# Patient Record
Sex: Female | Born: 1945 | Race: Black or African American | Hispanic: No | Marital: Married | Smoking: Never smoker
Health system: Southern US, Community
[De-identification: ages and names within clinical notes are randomized; demographics above are authoritative.]

## PROBLEM LIST (undated history)

## (undated) DIAGNOSIS — R739 Hyperglycemia, unspecified: Secondary | ICD-10-CM

## (undated) DIAGNOSIS — I4891 Unspecified atrial fibrillation: Secondary | ICD-10-CM

## (undated) DIAGNOSIS — I10 Essential (primary) hypertension: Secondary | ICD-10-CM

## (undated) DIAGNOSIS — L910 Hypertrophic scar: Secondary | ICD-10-CM

## (undated) DIAGNOSIS — F419 Anxiety disorder, unspecified: Secondary | ICD-10-CM

## (undated) DIAGNOSIS — F32A Depression, unspecified: Secondary | ICD-10-CM

## (undated) DIAGNOSIS — E538 Deficiency of other specified B group vitamins: Secondary | ICD-10-CM

## (undated) DIAGNOSIS — F329 Major depressive disorder, single episode, unspecified: Secondary | ICD-10-CM

## (undated) DIAGNOSIS — Z9049 Acquired absence of other specified parts of digestive tract: Secondary | ICD-10-CM

## (undated) HISTORY — PX: HAND SURGERY: SHX662

## (undated) HISTORY — DX: Major depressive disorder, single episode, unspecified: F32.9

## (undated) HISTORY — PX: SMALL INTESTINE SURGERY: SHX150

## (undated) HISTORY — PX: TOE SURGERY: SHX1073

## (undated) HISTORY — DX: Hyperglycemia, unspecified: R73.9

## (undated) HISTORY — PX: MYOMECTOMY: SHX85

## (undated) HISTORY — DX: Acquired absence of other specified parts of digestive tract: Z90.49

## (undated) HISTORY — DX: Depression, unspecified: F32.A

## (undated) HISTORY — DX: Anxiety disorder, unspecified: F41.9

## (undated) HISTORY — PX: COLON SURGERY: SHX602

## (undated) HISTORY — DX: Hypertrophic scar: L91.0

## (undated) HISTORY — DX: Deficiency of other specified B group vitamins: E53.8

---

## 1998-10-03 ENCOUNTER — Encounter: Payer: Self-pay | Admitting: Endocrinology

## 1998-10-03 ENCOUNTER — Ambulatory Visit (HOSPITAL_COMMUNITY): Admission: RE | Admit: 1998-10-03 | Discharge: 1998-10-03 | Payer: Self-pay | Admitting: Endocrinology

## 1999-02-09 ENCOUNTER — Encounter: Payer: Self-pay | Admitting: Emergency Medicine

## 1999-02-09 ENCOUNTER — Emergency Department (HOSPITAL_COMMUNITY): Admission: EM | Admit: 1999-02-09 | Discharge: 1999-02-09 | Payer: Self-pay | Admitting: Emergency Medicine

## 2000-09-27 ENCOUNTER — Emergency Department (HOSPITAL_COMMUNITY): Admission: EM | Admit: 2000-09-27 | Discharge: 2000-09-27 | Payer: Self-pay | Admitting: Emergency Medicine

## 2001-03-19 ENCOUNTER — Encounter: Admission: RE | Admit: 2001-03-19 | Discharge: 2001-03-19 | Payer: Self-pay | Admitting: Obstetrics

## 2001-04-13 ENCOUNTER — Encounter: Admission: RE | Admit: 2001-04-13 | Discharge: 2001-04-13 | Payer: Self-pay

## 2001-04-20 ENCOUNTER — Ambulatory Visit (HOSPITAL_COMMUNITY): Admission: RE | Admit: 2001-04-20 | Discharge: 2001-04-20 | Payer: Self-pay | Admitting: Obstetrics

## 2001-11-15 ENCOUNTER — Inpatient Hospital Stay (HOSPITAL_COMMUNITY): Admission: AD | Admit: 2001-11-15 | Discharge: 2001-11-15 | Payer: Self-pay | Admitting: Obstetrics & Gynecology

## 2002-03-23 ENCOUNTER — Ambulatory Visit (HOSPITAL_COMMUNITY): Admission: RE | Admit: 2002-03-23 | Discharge: 2002-03-23 | Payer: Self-pay | Admitting: Obstetrics

## 2002-03-23 ENCOUNTER — Encounter: Payer: Self-pay | Admitting: Obstetrics

## 2002-04-05 ENCOUNTER — Emergency Department (HOSPITAL_COMMUNITY): Admission: EM | Admit: 2002-04-05 | Discharge: 2002-04-05 | Payer: Self-pay | Admitting: Emergency Medicine

## 2002-05-06 ENCOUNTER — Emergency Department (HOSPITAL_COMMUNITY): Admission: EM | Admit: 2002-05-06 | Discharge: 2002-05-06 | Payer: Self-pay | Admitting: Emergency Medicine

## 2002-05-06 ENCOUNTER — Encounter: Payer: Self-pay | Admitting: Emergency Medicine

## 2003-08-30 ENCOUNTER — Encounter: Admission: RE | Admit: 2003-08-30 | Discharge: 2003-08-30 | Payer: Self-pay | Admitting: Obstetrics and Gynecology

## 2004-01-22 ENCOUNTER — Emergency Department (HOSPITAL_COMMUNITY): Admission: EM | Admit: 2004-01-22 | Discharge: 2004-01-22 | Payer: Self-pay | Admitting: Emergency Medicine

## 2004-02-22 ENCOUNTER — Emergency Department (HOSPITAL_COMMUNITY): Admission: EM | Admit: 2004-02-22 | Discharge: 2004-02-22 | Payer: Self-pay | Admitting: Emergency Medicine

## 2004-03-13 ENCOUNTER — Emergency Department (HOSPITAL_COMMUNITY): Admission: EM | Admit: 2004-03-13 | Discharge: 2004-03-14 | Payer: Self-pay

## 2004-06-01 ENCOUNTER — Ambulatory Visit (HOSPITAL_COMMUNITY): Admission: RE | Admit: 2004-06-01 | Discharge: 2004-06-01 | Payer: Self-pay | Admitting: Internal Medicine

## 2004-07-13 ENCOUNTER — Ambulatory Visit (HOSPITAL_COMMUNITY): Admission: RE | Admit: 2004-07-13 | Discharge: 2004-07-13 | Payer: Self-pay | Admitting: Obstetrics

## 2004-07-30 ENCOUNTER — Encounter: Admission: RE | Admit: 2004-07-30 | Discharge: 2004-10-09 | Payer: Self-pay | Admitting: Family Medicine

## 2005-01-25 ENCOUNTER — Encounter: Admission: RE | Admit: 2005-01-25 | Discharge: 2005-04-25 | Payer: Self-pay | Admitting: Family Medicine

## 2007-01-05 ENCOUNTER — Encounter: Admission: RE | Admit: 2007-01-05 | Discharge: 2007-01-05 | Payer: Self-pay | Admitting: Internal Medicine

## 2011-01-06 ENCOUNTER — Encounter: Payer: Self-pay | Admitting: *Deleted

## 2011-01-06 ENCOUNTER — Encounter: Payer: Self-pay | Admitting: Internal Medicine

## 2011-01-07 ENCOUNTER — Encounter: Payer: Self-pay | Admitting: Internal Medicine

## 2011-05-03 NOTE — Group Therapy Note (Signed)
   NAME:  Anita Dickerson, Anita Dickerson                        ACCOUNT NO.:  [id number]   MEDICAL RECORD NO.:  [id number]                   PATIENT TYPE:  OUT   LOCATION:  [facility]                           FACILITY:  [facility]   PHYSICIAN:  Ellis Parents, MD                 DATE OF BIRTH:  01/21/1946   DATE OF SERVICE:                                    CLINIC NOTE   This is a 65 year old postmenopausal female with a chief complaint of six  months of hot flashes and mood changes.  The patient requests Premarin and  progesterone for treatment.  She says her hot flashes last for hours at a  time and she gets hot flashes and flushes every day.  She has also had  difficulty sleeping for several years.  The patient also notes increased  stress due to caring for her mother who lives at home with her.  Her mother  has dementia and several other health problems.  The patient has been  prescribed Prempro in the past but did not want to take that medicine.  The  patient was offered venlafaxine, Effexor XR, for anxiety/insomnia but the  patient declined this medicine.  The patient was also counseled on the  benefits and risks of hormone replacement therapy and the patient said she  will think about trying Premarin and progesterone and will return to the  clinic if she desires to start hormone replacement therapy.  The patient's  last Pap smear was in 2002.  We asked her if she wanted to have a Pap smear  done today.  She declined and said she will return at a later visit.  The  patient's last mammogram was done in 2002 and she will schedule a mammogram  when she comes back for her next visit.  The patient also says she has had a  DEXA scan done one year ago at [facility] and we will try to obtain the records of  that DEXA scan when she comes back for her next visit.     Ellis Parents, MD                       Ellis Parents, MD    SA/MEDQ  D:  08/30/2003  T:  08/30/2003  Job:  [id number]

## 2011-05-19 ENCOUNTER — Emergency Department (HOSPITAL_COMMUNITY): Payer: Medicare Other

## 2011-05-19 ENCOUNTER — Inpatient Hospital Stay (HOSPITAL_COMMUNITY)
Admission: EM | Admit: 2011-05-19 | Discharge: 2011-05-31 | DRG: 329 | Disposition: A | Discharge status: Home / Self Care | Payer: Medicare Other | Attending: Surgery | Admitting: Surgery

## 2011-05-19 DIAGNOSIS — E871 Hypo-osmolality and hyponatremia: Secondary | ICD-10-CM | POA: Diagnosis present

## 2011-05-19 DIAGNOSIS — I498 Other specified cardiac arrhythmias: Secondary | ICD-10-CM | POA: Diagnosis not present

## 2011-05-19 DIAGNOSIS — K560 Paralytic ileus: Secondary | ICD-10-CM | POA: Diagnosis not present

## 2011-05-19 DIAGNOSIS — K562 Volvulus: Secondary | ICD-10-CM | POA: Diagnosis present

## 2011-05-19 DIAGNOSIS — K912 Postsurgical malabsorption, not elsewhere classified: Secondary | ICD-10-CM | POA: Diagnosis present

## 2011-05-19 DIAGNOSIS — K565 Intestinal adhesions [bands], unspecified as to partial versus complete obstruction: Principal | ICD-10-CM | POA: Diagnosis present

## 2011-05-19 DIAGNOSIS — K55059 Acute (reversible) ischemia of intestine, part and extent unspecified: Secondary | ICD-10-CM | POA: Diagnosis present

## 2011-05-19 DIAGNOSIS — R197 Diarrhea, unspecified: Secondary | ICD-10-CM | POA: Diagnosis present

## 2011-05-19 DIAGNOSIS — E875 Hyperkalemia: Secondary | ICD-10-CM | POA: Diagnosis present

## 2011-05-19 DIAGNOSIS — R112 Nausea with vomiting, unspecified: Secondary | ICD-10-CM | POA: Diagnosis present

## 2011-05-19 LAB — CBC
MCH: 29.8 pg (ref 26.0–34.0)
MCHC: 33.7 g/dL (ref 30.0–36.0)
MCV: 88.4 fL (ref 78.0–100.0)
Platelets: 220 10*3/uL (ref 150–400)
RDW: 14.8 % (ref 11.5–15.5)
WBC: 7.5 10*3/uL (ref 4.0–10.5)

## 2011-05-19 LAB — COMPREHENSIVE METABOLIC PANEL
Alkaline Phosphatase: 85 U/L (ref 39–117)
CO2: 25 mEq/L (ref 19–32)
Calcium: 9.5 mg/dL (ref 8.4–10.5)
Chloride: 96 mEq/L (ref 96–112)
Creatinine, Ser: 0.83 mg/dL (ref 0.4–1.2)
GFR calc Af Amer: >60 mL/min (ref >60)
GFR calc non Af Amer: >60 mL/min (ref >60)
Sodium: 134 mEq/L — ABNORMAL LOW (ref 135–145)
Total Protein: 8.3 g/dL (ref 6.0–8.3)

## 2011-05-19 LAB — URINALYSIS, ROUTINE W REFLEX MICROSCOPIC
Hgb urine dipstick: NEGATIVE
Specific Gravity, Urine: 1.043 — ABNORMAL HIGH (ref 1.005–1.030)
pH: 6 (ref 5.0–8.0)

## 2011-05-19 LAB — LIPASE, BLOOD: Lipase: 29 U/L (ref 11–59)

## 2011-05-19 LAB — DIFFERENTIAL
Basophils Absolute: 0 10*3/uL (ref 0.0–0.1)
Monocytes Absolute: 0.6 10*3/uL (ref 0.1–1.0)

## 2011-05-19 MED ORDER — IOHEXOL 300 MG/ML  SOLN
100.0000 mL | Freq: Once | INTRAMUSCULAR | Status: AC | PRN
  Administered 2011-05-19: 100 mL via INTRAVENOUS

## 2011-05-20 ENCOUNTER — Inpatient Hospital Stay (HOSPITAL_COMMUNITY): Payer: Medicare Other

## 2011-05-20 ENCOUNTER — Other Ambulatory Visit: Payer: Self-pay | Admitting: General Surgery

## 2011-05-20 LAB — BASIC METABOLIC PANEL
BUN: 18 mg/dL (ref 6–23)
BUN: 11 mg/dL (ref 6–23)
CO2: 23 mEq/L (ref 19–32)
Calcium: 8.7 mg/dL (ref 8.4–10.5)
Calcium: 7.3 mg/dL — ABNORMAL LOW (ref 8.4–10.5)
Chloride: 102 mEq/L (ref 96–112)
Creatinine, Ser: 0.7 mg/dL (ref 0.4–1.2)
Creatinine, Ser: 0.47 mg/dL (ref 0.4–1.2)
GFR calc Af Amer: >60 mL/min (ref >60)
Potassium: 4.5 mEq/L (ref 3.5–5.1)
Sodium: 132 mEq/L — ABNORMAL LOW (ref 135–145)
Sodium: 130 mEq/L — ABNORMAL LOW (ref 135–145)

## 2011-05-20 LAB — CBC
Hemoglobin: 11 g/dL — ABNORMAL LOW (ref 12.0–15.0)
MCH: 29.2 pg (ref 26.0–34.0)
MCH: 29.1 pg (ref 26.0–34.0)
MCHC: 32.7 g/dL (ref 30.0–36.0)
MCV: 89.3 fL (ref 78.0–100.0)
MCV: 88.6 fL (ref 78.0–100.0)
RBC: 3.78 MIL/uL — ABNORMAL LOW (ref 3.87–5.11)
RDW: 15 % (ref 11.5–15.5)
RDW: 14.6 % (ref 11.5–15.5)
WBC: 2.3 10*3/uL — ABNORMAL LOW (ref 4.0–10.5)

## 2011-05-20 LAB — PHOSPHORUS: Phosphorus: 2.6 mg/dL (ref 2.3–4.6)

## 2011-05-20 LAB — MRSA PCR SCREENING: MRSA by PCR: NEGATIVE

## 2011-05-20 LAB — CA 125: CA 125: 8.2 U/mL (ref 0.0–30.2)

## 2011-05-21 ENCOUNTER — Inpatient Hospital Stay (HOSPITAL_COMMUNITY): Payer: Medicare Other

## 2011-05-21 LAB — PHOSPHORUS: Phosphorus: 2.1 mg/dL — ABNORMAL LOW (ref 2.3–4.6)

## 2011-05-21 LAB — BASIC METABOLIC PANEL
CO2: 25 mEq/L (ref 19–32)
Calcium: 7.3 mg/dL — ABNORMAL LOW (ref 8.4–10.5)
Chloride: 99 mEq/L (ref 96–112)
Creatinine, Ser: 0.53 mg/dL (ref 0.4–1.2)
Glucose, Bld: 125 mg/dL — ABNORMAL HIGH (ref 70–99)
Potassium: 4.5 mEq/L (ref 3.5–5.1)
Sodium: 129 mEq/L — ABNORMAL LOW (ref 135–145)

## 2011-05-21 LAB — MAGNESIUM: Magnesium: 1.9 mg/dL (ref 1.5–2.5)

## 2011-05-21 LAB — CBC
MCH: 29.3 pg (ref 26.0–34.0)
MCHC: 33.5 g/dL (ref 30.0–36.0)
MCV: 87.7 fL (ref 78.0–100.0)
RBC: 3.17 MIL/uL — ABNORMAL LOW (ref 3.87–5.11)
WBC: 5.7 10*3/uL (ref 4.0–10.5)

## 2011-05-22 DIAGNOSIS — I471 Supraventricular tachycardia: Secondary | ICD-10-CM

## 2011-05-22 LAB — DIFFERENTIAL
Basophils Relative: 0 % (ref 0–1)
Eosinophils Absolute: 0 10*3/uL (ref 0.0–0.7)
Monocytes Absolute: 0.6 10*3/uL (ref 0.1–1.0)
Neutrophils Relative %: 82 % — ABNORMAL HIGH (ref 43–77)

## 2011-05-22 LAB — COMPREHENSIVE METABOLIC PANEL
Albumin: 1.6 g/dL — ABNORMAL LOW (ref 3.5–5.2)
Alkaline Phosphatase: 50 U/L (ref 39–117)
BUN: 7 mg/dL (ref 6–23)
Potassium: 3.7 mEq/L (ref 3.5–5.1)
Total Protein: 4.7 g/dL — ABNORMAL LOW (ref 6.0–8.3)

## 2011-05-22 LAB — GLUCOSE, CAPILLARY
Glucose-Capillary: 121 mg/dL — ABNORMAL HIGH (ref 70–99)
Glucose-Capillary: 137 mg/dL — ABNORMAL HIGH (ref 70–99)

## 2011-05-22 LAB — TRIGLYCERIDES: Triglycerides: 104 mg/dL (ref <150)

## 2011-05-22 LAB — CBC
MCV: 87.8 fL (ref 78.0–100.0)
Platelets: 110 10*3/uL — ABNORMAL LOW (ref 150–400)
RDW: 14.6 % (ref 11.5–15.5)
WBC: 6.9 10*3/uL (ref 4.0–10.5)

## 2011-05-22 LAB — PHOSPHORUS: Phosphorus: 1.9 mg/dL — ABNORMAL LOW (ref 2.3–4.6)

## 2011-05-22 LAB — CHOLESTEROL, TOTAL: Cholesterol: 105 mg/dL (ref 0–200)

## 2011-05-22 NOTE — Op Note (Signed)
NAMEMEHR, DEPAOLI              ACCOUNT NO.:  [id number]  MEDICAL RECORD NO.:  [id number]  LOCATION:  1230                         FACILITY:  [facility]  PHYSICIAN:  Anselm Pancoast. Bryonna Sundby, M.D.DATE OF BIRTH:  1946-01-10  DATE OF PROCEDURE:  05/20/2011 DATE OF DISCHARGE:                              OPERATIVE REPORT   PREOPERATIVE DIAGNOSIS:  Small-bowel obstruction probably secondary to adhesions.  POSTOPERATIVE DIAGNOSIS:  Infarcted about 70 inches of small bowel, at least all of the ileum secondary to mid-gut volvulus and adhesion kind of from the left fallopian tube.  SURGEON:  Anselm Pancoast. Zachery Dakins, MD  ASSISTANT:  __________  HISTORY:  Anita Dickerson is a 65 year old black female who is quite exercise and though she is very petite and according to her husband, she does not really have a problem with bowel function, but she uses laxatives frequently and exercises quite vigorously for basically to keep herself trim and thin.  She started having bad onset of abdominal pain on Friday evening.  She had a previous partial hysterectomy done vaginally for a fibroid I think 2 years ago, and no previous other abdominal surgery.  She took some Milk of Magnesia and gave herself an enema, had a bowel movement but the pain persisted and then she presented to the emergency room on May 19, 2011, and was seen by Dr. Gerrit Friends.  She thought that she had food poisoning, says she had eaten out on Friday and on CT which was done yesterday showed a near complete obstruction in the upper pelvis and distal small bowel consistent with a small bowel obstruction.  General Surgery was called for admission and she was examined by Dr. Gerrit Friends.  He noted tender abdomen and was mildly distended.  No signs of hernias.  Mild diffuse tenderness and a few bowel sounds.  Her white count was only 7500, platelet count normal, electrolytes normal and he felt that a trial of conservative management was needed since  she had had some bowel movements after taking the laxative.  She did have a moderate volume of ascites felt to be secondary to reactive.  This morning I saw the patient on exam and her abdomen was quite quiet, she was definitely distended.  She had had some bilious NG drainage and repeat abdominal x-rays were not improved.  I recommended that we go ahead and add her to the OR schedule.  She said her husband was down at [facility] where he receives treatment for posttraumatic stress syndrome following the [place] War problems.  He was called and she was added to the OR schedule, hopefully schedule about 11 o'clock.  Surgery was a little late in getting started, and she was given 3 g Unasyn, PAS stockings, positioned on the OR table.  A Foley catheter was inserted and __________ she still had a very firm area in the lower abdomen.  I draped after prepping her with Betadine solution in a sterile manner.  Foley catheter was inserted sterilely and a small incision was made below the umbilicus and then carefully entered through the fascia and carefully into the peritoneal cavity.  There was a large amount of kind of hemorrhagic ascites type fluid and I opened  the incision further.  The small bowel we could see down in the distal small bowel was all frankly necrotic, but fortunately she had not had a free perforation.  He could feel and see she had had something going on in the left pelvis and the bowel was twisted on itself as well as an adhesion going to the left tube.  I first divided the little adhesion but then the bowel itself little area where the adhesion was pulled out on it was necrotic and there was a small amount of intestinal contents loss.  We put a sucker in the area to more decompress her and then with this band divided, we could then see that she was frankly necrotic right on to the ileocecal valve, there was probably about 1 inch of distal ileum was still viable and then the proximal  portion of viability was at least half the small bowel.  I used a GIA to divide the distal small bowel, really taking it away at the ileocecal valve area.  The appendix was not inflamed and kind of retrocecal position and we later removed the appendix.  I then used Kelly's to go across the mesentery of this necrotic segment of bowel and then took it on up to where the area viability is and then used a second fire of the GIA to transect the bowel at this area slightly obliquely.  I later measured the part of bowel that was removed and using an umbilical tape, supposedly 30 inches, it seems to me I probably removed about 75 inches of her small bowel, I am sure that is all of the ileum and at least was probably a small amount of the actual jejunum.  Later we checked the small bowel and there were no other areas of this adhesion, was that the area had herniated under causing the obstruction was really to the left tube and ovary.  The tube itself looked unremarkable as did the ovary.  I straightened out the intestine and tried to milk the NG contents back into the stomach.  The NG tube is in the stomach and we were getting some contents back but she was so distended that I could not get anything to really decompress the small bowel.  Since we had removed the more distended segment of bowel, I elected to bring the bowel down and it looked like we could do a side to side anastomosis to any of the cecum.  I had removed the appendix, tied the base with 2-0 Vicryl in a pursestring of 2-0 silk and then used a GIA 55 on the tenia and the antimesenteric surface and then basically inverted this little area where the staple was going in, sutured that with a 3-0 running Vicryl and then interrupted sutures of 3-0 Lembert silk.  The bowel was lying in a comfortable position.  She was still distended enough that I went back and rechecked so that the proximal small bowel is lying predominately on the left,  down into the pelvis and then the little area coming up to the cecal.  I closed the little mesentery making sure there was no evidence of anything that can further herniate through and then brought the small amount of omentum that she has down over the small bowel.  The little adhesion where that had gone to the left fallopian tube was removed and I did a little Vicryl tie at the base of this adhesion.  The fascia was closed with a looped 0  PDS, some interrupted 0 Prolene and then the skin was closed with staples.  I had extended the incision a little bit above the umbilicus and she has probably got about a 4.5 inch incision.  She is thin enough, I think that gave me adequate exposure for the surgery.  We thoroughly irrigated her with saline, everything was returning clear.  She is on antibiotics which I want to keep and she is going to need probably a PICC line and then I would go ahead and start on hyperalimentation, we removed such a large segment of her small bowel and she is petite individual.  Sponge and needle counts were correct x2.  The estimated blood loss was minimal.     Anselm Pancoast. Zachery Dakins, M.D.     WJW/MEDQ  D:  05/20/2011  T:  05/20/2011  Job:  [id number]  Electronically Signed by Consuello Bossier M.D. on 05/22/2011 01:40:36 PM

## 2011-05-23 ENCOUNTER — Inpatient Hospital Stay (HOSPITAL_COMMUNITY): Payer: Medicare Other

## 2011-05-23 LAB — TSH: TSH: 1.498 u[IU]/mL (ref 0.350–4.500)

## 2011-05-23 LAB — CBC
Hemoglobin: 7.3 g/dL — ABNORMAL LOW (ref 12.0–15.0)
MCH: 29.4 pg (ref 26.0–34.0)
MCV: 87.1 fL (ref 78.0–100.0)
RBC: 2.48 MIL/uL — ABNORMAL LOW (ref 3.87–5.11)

## 2011-05-23 LAB — COMPREHENSIVE METABOLIC PANEL
ALT: 9 U/L (ref 0–35)
AST: 17 U/L (ref 0–37)
CO2: 27 mEq/L (ref 19–32)
Chloride: 101 mEq/L (ref 96–112)
GFR calc Af Amer: >60 mL/min (ref >60)
GFR calc non Af Amer: >60 mL/min (ref >60)
Potassium: 3.2 mEq/L — ABNORMAL LOW (ref 3.5–5.1)
Sodium: 134 mEq/L — ABNORMAL LOW (ref 135–145)
Total Bilirubin: 0.1 mg/dL — ABNORMAL LOW (ref 0.3–1.2)

## 2011-05-24 ENCOUNTER — Inpatient Hospital Stay (HOSPITAL_COMMUNITY): Payer: Medicare Other

## 2011-05-24 LAB — BASIC METABOLIC PANEL
BUN: 7 mg/dL (ref 6–23)
Creatinine, Ser: 0.56 mg/dL (ref 0.4–1.2)
GFR calc non Af Amer: >60 mL/min (ref >60)
Glucose, Bld: 115 mg/dL — ABNORMAL HIGH (ref 70–99)

## 2011-05-24 LAB — GLUCOSE, CAPILLARY
Glucose-Capillary: 103 mg/dL — ABNORMAL HIGH (ref 70–99)
Glucose-Capillary: 116 mg/dL — ABNORMAL HIGH (ref 70–99)

## 2011-05-24 LAB — PHOSPHORUS: Phosphorus: 3.4 mg/dL (ref 2.3–4.6)

## 2011-05-24 NOTE — Consult Note (Signed)
Anita Dickerson, POLYAK NO.:  [id number]  MEDICAL RECORD NO.:  [id number]  LOCATION:  1230                         FACILITY:  [facility]  PHYSICIAN:  Luis Abed, MD, FACCDATE OF BIRTH:  May 18, 1946  DATE OF CONSULTATION: DATE OF DISCHARGE:                                CONSULTATION   PRIMARY CARE PHYSICIAN:  Fleet Contras, M.D.  HISTORY OF PRESENT ILLNESS:  The patient is currently hospitalized with small bowel obstruction and ischemic bowel.  She has been treated successfully with Surgery.  She has had rapid narrow-complex arrhythmias and her blood pressures have been stable with this.  We are consulted to help with the arrhythmia.  The patient's blood pressure has been stable. She says that she has had rapid heart rates in the past when she has been excited particularly with panic attacks.  She says that she has seen a cardiologist at one time in the past but cannot tell me who.  She also cannot tell what type of workup she has had.  In her past medical history, it says that she has had a myomectomy.  This is not a cardiac myomectomy.  It has to do with a GYN procedure.  The patient is quite active.  She dances on a regular basis.  She gives no sign or symptoms of prior significant cardiac problems.  She is stable at this time.  ALLERGIES:  No known drug allergies.  MEDICATIONS:  At this time I cannot document the admission medications. Her hospital medications are listed within the system.  OTHER MEDICAL PROBLEMS:  See the complete list below.  SOCIAL HISTORY:  The patient is married.  She does not smoke.  FAMILY HISTORY:  There is no significant family history of significant arrhythmias or coronary disease.  REVIEW OF SYSTEMS:  At this time, she is comfortable in bed.  She is drowsy with medications.  She has a nasogastric tube in place on suction.  She is oriented to person, time and place.  She denies fever or chills at this point.  She denies  headache, chest pain, cough or urinary symptoms.  All other systems are reviewed and are negative.  PHYSICAL EXAMINATION:  VITAL SIGNS:  Current blood pressure is 120/60 with a rate of 90.  While talking to her, her heart rate did increase to 110.  This appeared to be a rhythm with a short PR interval. HEENT:  Head is atraumatic.  There is no xanthelasma.  There are no carotid bruits. NECK:  No jugular venous distention. LUNGS:  Clear. RESPIRATORY SYSTEM:  Respiratory effort is not labored. CARDIAC EXAM:  Reveals S1 and S2.  There are no clicks or significant murmurs. ABDOMEN:  Her abdomen is postsurgical.  There is no significant peripheral edema.  LABORATORY DATA:  The patient's sodium is 131, potassium 3.7, BUN 7 and creatinine 0.58.  Her EKG on May 20, 2011, revealed minor nonspecific ST- T wave changes.  There was sinus rhythm with multiple premature atrial contractions.  Chest x-ray on May 19, 2011, had shown question of some emphysema.  Otherwise, there was no other significant obvious cardiopulmonary abnormality.  Many rhythm strips are reviewed.  The patient has  sinus rhythm.  At times, she has heart rate as fast as 165 that it is completely regular.  At another time, there is a heart rate 140.  At other times, she has normal sinus rhythm with PACs.  PROBLEMS: 1. Status post GI surgery for ischemic bowel.  She has had major fluid     shifts.  She is doing very well. 2. Rapid supraventricular tachycardia.  At this time I am not     completely sure what the rhythm is.  There are no flutter waves.  I     am not convinced it is atrial fibrillation.  This could be a re-     entrant supraventricular tachycardia.  It could also be a true     atrial tachycardia.  She has been on IV Cardizem.  It is not clear     if this helps or not.  The plan will be to continue her IV Cardizem     at this point.  I will add IV beta blocker.  If her burst of SVT     stops with the beta blocker,  I would stop the Cardizem and keep her     on the beta blocker until she can take this p.o.  In addition a 2-D     echo will be arranged for more complete assessment and TSH will be     checked concerning her thyroid.  It is of note that there is a mention in the chart that she had a myomectomy at [facility] in the past.  This was not a cardiac myomectomy.  It had to do with her GYN system.     Luis Abed, MD, [facility]     JDK/MEDQ  D:  05/22/2011  T:  05/22/2011  Job:  [id number]  cc:   Velora Heckler, MD [address] [place] [address]  Fleet Contras, M.D. Fax: [phone]  Electronically Signed by Willa Rough MD [facility] on 05/24/2011 05:46:30 PM

## 2011-05-25 ENCOUNTER — Inpatient Hospital Stay (HOSPITAL_COMMUNITY): Payer: Medicare Other

## 2011-05-25 LAB — BASIC METABOLIC PANEL
BUN: 8 mg/dL (ref 6–23)
Chloride: 100 mEq/L (ref 96–112)
Creatinine, Ser: 0.54 mg/dL (ref 0.4–1.2)
GFR calc Af Amer: >60 mL/min (ref >60)
GFR calc non Af Amer: >60 mL/min (ref >60)

## 2011-05-25 LAB — GLUCOSE, CAPILLARY: Glucose-Capillary: 160 mg/dL — ABNORMAL HIGH (ref 70–99)

## 2011-05-25 LAB — MAGNESIUM: Magnesium: 2.1 mg/dL (ref 1.5–2.5)

## 2011-05-26 LAB — BASIC METABOLIC PANEL
BUN: 10 mg/dL (ref 6–23)
CO2: 27 mEq/L (ref 19–32)
Chloride: 101 mEq/L (ref 96–112)
GFR calc non Af Amer: >60 mL/min (ref >60)
Glucose, Bld: 113 mg/dL — ABNORMAL HIGH (ref 70–99)
Potassium: 3.9 mEq/L (ref 3.5–5.1)
Sodium: 134 mEq/L — ABNORMAL LOW (ref 135–145)

## 2011-05-29 LAB — CLOSTRIDIUM DIFFICILE BY PCR: Toxigenic C. Difficile by PCR: NEGATIVE

## 2011-05-30 LAB — CBC
HCT: 21.9 % — ABNORMAL LOW (ref 36.0–46.0)
Hemoglobin: 7.2 g/dL — ABNORMAL LOW (ref 12.0–15.0)
MCV: 89 fL (ref 78.0–100.0)
RBC: 2.46 MIL/uL — ABNORMAL LOW (ref 3.87–5.11)
RDW: 15 % (ref 11.5–15.5)
WBC: 5.7 10*3/uL (ref 4.0–10.5)

## 2011-05-30 LAB — BASIC METABOLIC PANEL
BUN: 8 mg/dL (ref 6–23)
CO2: 23 mEq/L (ref 19–32)
Chloride: 108 mEq/L (ref 96–112)
Creatinine, Ser: 0.56 mg/dL (ref 0.4–1.2)
GFR calc Af Amer: >60 mL/min (ref >60)
Glucose, Bld: 99 mg/dL (ref 70–99)
Potassium: 3.5 mEq/L (ref 3.5–5.1)

## 2011-06-03 NOTE — H&P (Signed)
NAMEMADDEN, PIAZZA              ACCOUNT NO.:  [id number]  MEDICAL RECORD NO.:  [id number]           PATIENT TYPE:  I  LOCATION:  1534                         FACILITY:  [facility]  PHYSICIAN:  Velora Heckler, MD      DATE OF BIRTH:  27-Mar-1946  DATE OF ADMISSION:  05/19/2011                              HISTORY & PHYSICAL   REFERRING PHYSICIAN:  Devoria Albe, MD, emergency department.  PRIMARY CARE PHYSICIAN:  Fleet Contras, MD  CHIEF COMPLAINT:  Abdominal pain, nausea, vomiting, diarrhea.  HISTORY OF PRESENT ILLNESS:  Anita Dickerson is a 65 year old black female from [place], [place].  She presents to the emergency department with a 2-day history of abdominal pain, abdominal distention, nausea, vomiting, and diarrhea.  The patient had eaten in a restaurant on Friday and felt like she may have received some bad food.  On Saturday, she became ill and took milk of magnesia as well as an enema without symptomatic improvement.  The patient began having diarrhea as well as nausea and vomiting.  She presented to the emergency department at [facility] on June 3rd for evaluation.  The patient was seen and evaluated by the emergency room staff.  Abdominal x-ray showed evidence of partial small bowel obstruction.  CT scan abdomen and pelvis was obtained and showed near complete obstruction at the upper pelvis in the distal small bowel, consistent with small bowel obstruction, likely secondary to adhesions.  General Surgery was called for admission and management.  PAST MEDICAL HISTORY: 1. Status post myomectomy at [facility] in the     1990s. 2. Status post tonsillectomy. 3. History of foot surgery.  MEDICATIONS:  None.  ALLERGIES:  None known.  SOCIAL HISTORY:  The patient is married.  Her husband is at the bedside. They had 1 child who is deceased.  She denies tobacco use.  She does note occasional alcohol use.  FAMILY HISTORY:   Noncontributory.  A 15-system review without significant other findings except as noted above.  PHYSICAL EXAMINATION:  GENERAL:  A 65 year old thin black female on a stretcher in the emergency department, mild discomfort. VITAL SIGNS:  Temperature 98.2, pulse 64, respirations 16, blood pressure 170/96. HEENT:  Normocephalic, atraumatic.  Sclerae clear.  Conjunctivae clear. Dentition fair.  Nasogastric tube is positioned in the left nares. Palpation of the neck shows no thyroid nodularity.  No lymphadenopathy. No tenderness.  No mass. LUNGS:  Clear to auscultation bilaterally without rales, rhonchi, or wheeze. CARDIAC:  Regular rate and rhythm without murmur.  Peripheral pulses are full. EXTREMITIES:  Nontender without edema. NEUROLOGIC:  The patient is alert and oriented.  There is no focal deficit.  There is no sign of tremor. ABDOMEN:  Mildly distended.  There is a well-healed, low abdominal incision.  There are no sign of hernias.  There is mild diffuse tenderness.  There are a few bowel sounds on auscultation.  DATA REVIEWED:  Laboratory studies, CBC shows white count 7.5, hemoglobin 14.2, platelet count 220,000.  Electrolytes are normal. Creatinine 0.83.  Liver function test normal.  Lipase normal at 29.  RADIOGRAPHIC STUDIES:  Abdominal  x-ray showing evidence of dilated small bowel with air-fluid levels, most consistent with partial small bowel obstruction.  CT scan abdomen and pelvis showing a complete small bowel obstruction with transition point in the upper anatomic pelvis just to the left of midline.  There is a small pleural effusion.  There is moderate volume of ascites which are felt to be reactive.  IMPRESSION:  Small bowel obstruction, likely secondary to adhesions, cannot rule out occult malignancy.  PLAN:  The patient will be admitted on the general surgery service.  She has a nasogastric tube placed in the emergency department.  She will receive intravenous  fluids and be kept n.p.o. overnight.  I will repeat her laboratory studies and add a CA-125 level to her morning laboratories on June 4th.  Repeat 2-view abdominal x-ray will also be taken on the morning of June 4th.  The patient will be reassessed at that time.  The patient and I and her husband discussed these findings.  I told them that this small bowel obstruction had approximately a 50% chance of resolution with conservative management and approximately a 50% risk of need for laparotomy and lysis of adhesions if it does not resolve over the coming 24-48 hours.  They understand and agree to admission and treatment.   Velora Heckler, MD, FACS     TMG/MEDQ  D:  05/19/2011  T:  05/20/2011  Job:  [id number]  cc:   Fleet Contras, M.D. Fax: [phone]  Devoria Albe, M.D. [address] [place], [place] [address]  Electronically Signed by Darnell Level MD on 06/03/2011 09:24:45 AM

## 2011-06-07 ENCOUNTER — Other Ambulatory Visit (INDEPENDENT_AMBULATORY_CARE_PROVIDER_SITE_OTHER): Payer: Self-pay | Admitting: General Surgery

## 2011-06-07 LAB — CBC WITH DIFFERENTIAL/PLATELET
Eosinophils Relative: 2 % (ref 0–5)
Hemoglobin: 8.1 g/dL — ABNORMAL LOW (ref 12.0–15.0)
Lymphocytes Relative: 43 % (ref 12–46)
Lymphs Abs: 1.5 10*3/uL (ref 0.7–4.0)
MCH: 29.5 pg (ref 26.0–34.0)
MCV: 90.5 fL (ref 78.0–100.0)
Monocytes Relative: 11 % (ref 3–12)
Platelets: 341 10*3/uL (ref 150–400)
RBC: 2.75 MIL/uL — ABNORMAL LOW (ref 3.87–5.11)
WBC: 3.4 10*3/uL — ABNORMAL LOW (ref 4.0–10.5)

## 2011-06-07 LAB — COMPREHENSIVE METABOLIC PANEL
ALT: 12 U/L (ref 0–35)
CO2: 24 mEq/L (ref 19–32)
Calcium: 8.6 mg/dL (ref 8.4–10.5)
Chloride: 106 mEq/L (ref 96–112)
Creat: 0.69 mg/dL (ref 0.50–1.10)
Glucose, Bld: 86 mg/dL (ref 70–99)
Sodium: 140 mEq/L (ref 135–145)
Total Protein: 6.5 g/dL (ref 6.0–8.3)

## 2011-06-07 LAB — VITAMIN B12: Vitamin B-12: 344 pg/mL (ref 211–911)

## 2011-06-12 ENCOUNTER — Ambulatory Visit (INDEPENDENT_AMBULATORY_CARE_PROVIDER_SITE_OTHER): Payer: Medicare Other | Admitting: General Surgery

## 2011-06-12 NOTE — Progress Notes (Signed)
2 STAPLES WERE FOUND IN INCISION BY PT. PT HERE TO HAVE STAPLES REMOVED/ 2 STAPLES REMOVED WITHOUT DIFFICULTY/ INCISION UNREMARKABLE AND CLOSED/ NO REDNESS OR DRAINAGE/GY/06-12-11.

## 2011-06-17 ENCOUNTER — Ambulatory Visit (INDEPENDENT_AMBULATORY_CARE_PROVIDER_SITE_OTHER): Payer: Medicare Other | Admitting: General Surgery

## 2011-06-17 ENCOUNTER — Encounter (INDEPENDENT_AMBULATORY_CARE_PROVIDER_SITE_OTHER): Payer: Self-pay | Admitting: General Surgery

## 2011-06-17 ENCOUNTER — Encounter (INDEPENDENT_AMBULATORY_CARE_PROVIDER_SITE_OTHER): Payer: Medicare Other | Admitting: Surgery

## 2011-06-17 VITALS — BP 116/76 | HR 72 | Temp 97.0°F | Ht 67.0 in | Wt 115.8 lb

## 2011-06-17 DIAGNOSIS — Z09 Encounter for follow-up examination after completed treatment for conditions other than malignant neoplasm: Secondary | ICD-10-CM

## 2011-06-17 NOTE — Progress Notes (Signed)
Subjective:     Patient ID: Anita Dickerson, female   DOB: Jan 11, 1946, 66 y.o.   MRN: [id number]    BP 116/76  Pulse 72  Temp(Src) 97 F (36.1 C) (Temporal)  Ht 5\' 7"  (1.702 m)  Wt 115 lb 12.8 oz (52.527 kg)  BMI 18.14 kg/m2    HPIThis is a 65 year old female patient of Dr. Marena Chancy least. She notes underwent an exploratory laparotomy in early June for a small bowel obstruction. She apparently had a fall Vela surrounded adhesion and had about 70 cm of small intestine removed at that time. She was discharged home on 15 June of this month. Since then she's had a number of issues. She has one or 2 loose stools a day. She also complains of excessive flatus. She has no nausea and she has no vomiting. She is not eating normally because she secured to have the diarrhea. Her appetite has not returned to normal yet at all either. She reports no fevers or no other problems associated with this at all. She came in today just mostly because she had a lot of questions about her bowel function about her long-term health.   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 I told her that for a definitive answer she has been cut to come back and see Dr. Zachery Dakins. We discussed bowel function after a small bowel resection as well as short bowel syndrome. I discussed with her at length today that this would get better over some time after her small bowel adapted. It may take up to a year for this to be what her normal bowel function is going to be. I discussed with her eating frequent small meals per day. I recommended her using Imodium. We discussed making sure that she has protein supplements as well. She is going to come back and see Dr. Zachery Dakins in several weeks to answer the rest of her questions. I assured her  that all of her symptoms are fairly normal. I don't know where she's in terms of her bowel function over the long-term but did assure her that this would improve somewhat from where she was right now.

## 2011-06-18 NOTE — Discharge Summary (Signed)
NAMESHARLET, Dickerson              ACCOUNT NO.:  [id number]  MEDICAL RECORD NO.:  [id number]  LOCATION:  1431                         FACILITY:  [facility]  PHYSICIAN:  Thornton Park. Daphine Deutscher, MD  DATE OF BIRTH:  04/30/1946  DATE OF ADMISSION:  05/19/2011 DATE OF DISCHARGE:  05/31/2011                              DISCHARGE SUMMARY   ADMITTING PHYSICIAN:  Velora Heckler, M.D.  DISCHARGING PHYSICIAN:  Thornton Park. Daphine Deutscher, M.D.  PROCEDURES:  Exploratory laparotomy with approximately 70 inches of small bowel resection by Dr. Consuello Bossier on May 20, 2011.  CONSULTANTS:  Luis Abed, M.D., [facility] with cardiology.  PRIMARY CARE PHYSICIAN:  Fleet Contras, M.D.  REASON FOR ADMISSION:  Anita Dickerson is a 65 year old black female, who lives in [place], who presented to the Emergency Department with 2-day history of abdominal pain, distention, nausea, vomiting and diarrhea. Friday, she ate at a restaurant and thought she had gotten some bad food and became ill on Saturday.  She took some Milk of Magnesia as well as an enema without improvement.  She then presented to [facility] Emergency Department where she had a CT scan, which revealed a near complete obstruction in the upper pelvis and the distal small bowel consistent with a bowel obstruction likely secondary to adhesions. Please see admitting history and physical for further details.  ADMITTING DIAGNOSIS:  Small bowel obstruction likely secondary to adhesions.  HOSPITAL COURSE:  The time the patient was admitted, an NG tube was placed.  The following morning, the patient was seen for evaluation.  At this time, her abdomen was very distended and diffusely tender.  Her potassium was elevated at 5.2 and her x-rays showed increasing small- bowel dilatation.  At this time, it was felt the patient needed emergent surgical intervention.  Upon entry into the abdomen, the patient was found to have a mid gut volvulus.  She had approximately 70  inches of ischemic bowel.  This was resected and the patient was given an anastomosis.  Postoperatively, the patient had NG tube placed.  She was kept n.p.o. for several days awaiting postoperative ileus.  On postoperative day #5, the patient began having bowel movements and passing flatus.  At this time, her NG tube was discontinued and she was started on a clear liquid diet.  She had had a PICC line placed and TNA started.  This was discontinued at this time as well.  Over the next several days, the patient's diet was advanced to as tolerated.  She did begin having quite a bit of diarrhea.  Clostridium difficile was checked; however, this was negative.  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  Otherwise, by postoperative day #10, the patient was tolerating a regular diet with pain control appropriate.  Her abdomen was soft, minimally tender and nondistended. Her incision was clean, dry and intact with staples and these were discontinued before going home.  Postoperatively, the patient had some tachycardia.  Cardiology was asked to evaluate the patient.  It was felt that she may have some atrial tachycardia.  She was initially placed on IV Cardizem as well as an IV beta-blocker.  Once she was  taking p.o.'s, the Cardizem was stopped and she was continued on 25 mg of metoprolol b.i.d.  This was controlled throughout the rest of the hospitalization.  DISCHARGE DIAGNOSES: 1. 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 2. Postoperative ileus, resolved. 3. 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 4. Atrial tachycardia, which is improved on metoprolol.  DISCHARGE MEDICATIONS:  Please see medication reconciliation form.  DISCHARGE INSTRUCTIONS:  The patient may increase her activity slowly and walk up steps.   She may shower; however, she is not to bathe for the next 2 weeks.  She is not to do any heavy lifting over 15-20 pounds for approximately the next 6 weeks.  She is not to drive for the next 1-2 weeks and is to refrain from sexual activity for the next 2-4 weeks. She has no dietary restrictions; however, she is encouraged to take note of what causes diarrhea she may want to stay away from certain foods. She is also encouraged to take a fiber supplement for bowel regimen on a daily basis.  She is also informed to take Imodium as needed for diarrhea.  She is to return to see Dr. Zachery Dakins in our office in approximately 2 weeks.     Anita Cape, PA   ______________________________ Thornton Park Daphine Deutscher, MD    KEO/MEDQ  D:  05/31/2011  T:  05/31/2011  Job:  [id number]  cc:   Luis Abed, MD, [facility] [address]  [address] [place] [address]  Fleet Contras, M.D. Fax: [phone]  Anselm Pancoast. Zachery Dakins, M.D. [address] [place] [address]  Electronically Signed by Barnetta Chapel PA on 06/06/2011 01:24:26 PM Electronically Signed by Luretha Murphy MD on 06/18/2011 10:16:39 AM

## 2011-06-26 ENCOUNTER — Encounter (INDEPENDENT_AMBULATORY_CARE_PROVIDER_SITE_OTHER): Payer: Self-pay | Admitting: General Surgery

## 2011-06-26 ENCOUNTER — Ambulatory Visit (INDEPENDENT_AMBULATORY_CARE_PROVIDER_SITE_OTHER): Payer: Medicare Other | Admitting: General Surgery

## 2011-06-26 VITALS — BP 135/83 | HR 88 | Temp 97.8°F | Ht 67.0 in | Wt 113.6 lb

## 2011-06-26 DIAGNOSIS — Z9889 Other specified postprocedural states: Secondary | ICD-10-CM

## 2011-06-26 DIAGNOSIS — Z9049 Acquired absence of other specified parts of digestive tract: Secondary | ICD-10-CM

## 2011-06-26 NOTE — Progress Notes (Signed)
Ms. is day returns nail proximally 6 weeks following her emergency surgery for an incarcerated terminal ileum that had infarcted and a proximal B. 75 inches including the whole terminal ileum. She was in L5 patient and had a lot of cramping bloating and problems with except in her physical problem that has been seen in the office on 3 occasions and appears to be improving her midline incision has healed nicely. I have asked her on the 2 occasions and I've seen her to please try to keep a record of what foods tend to cause more cramping and she says she's refuses to keep a record and it's not recommended that she needs to keep it continuously [employer] figure out what foods she is unable to tolerate without problems she said yesterday she had crackers peanut better I cannot clear Ensure applesauce and cervical of the items and did not have to have any episodes of diarrhea at night but she did have cramping prior to having a bowel movement before retiring her vital signs were reviewed and her pulses in her 47s her previously her pulse was always proximally 100 and she looks nutritionally well nourished but has still quite a lot of problems except and what occurred to her.  She has had a psychiatric evaluation since her discharge from the hospital and she informed me that she was told that she was not grossly  I again please refer to please try to keep a record so that we can see if there is any pattern on what foods tends to cause cramping and what foods that she can tolerate a diet her pain will return to see me in approximately 2 weeks and will have a repeat CBC and see meds prior to that visit her only lab study that was abnormal on the studies obtained 2 weeks earlier was a low hematocrit 24 and this has been a chronic problem she has been on iron previously and we'll start taking iron on a regular basis at this time. We checked her B12 level which was in a normal right and that she is aware that with the  resection of the terminal ileum she probably will need B12 shots or rectal basis at a later time  I tried to explain to her that there is no definite foods that she cannot take but some foods may cause more cramping than others there is no reason that she cannot keep best of what foods agree or disagreed with her.

## 2011-06-26 NOTE — Patient Instructions (Signed)
Please try to keep record of what foods cuase cramping or loose stools. Try to eat three meals a day. Activity is not restricted

## 2011-07-04 ENCOUNTER — Encounter (INDEPENDENT_AMBULATORY_CARE_PROVIDER_SITE_OTHER): Payer: Self-pay | Admitting: General Surgery

## 2011-07-12 ENCOUNTER — Encounter (INDEPENDENT_AMBULATORY_CARE_PROVIDER_SITE_OTHER): Payer: Medicare Other | Admitting: General Surgery

## 2011-07-18 ENCOUNTER — Encounter (INDEPENDENT_AMBULATORY_CARE_PROVIDER_SITE_OTHER): Payer: Medicare Other | Admitting: General Surgery

## 2011-07-22 ENCOUNTER — Other Ambulatory Visit (INDEPENDENT_AMBULATORY_CARE_PROVIDER_SITE_OTHER): Payer: Self-pay | Admitting: General Surgery

## 2011-07-23 LAB — COMPREHENSIVE METABOLIC PANEL
ALT: <8 U/L (ref 0–35)
AST: 12 U/L (ref 0–37)
Albumin: 4.4 g/dL (ref 3.5–5.2)
Alkaline Phosphatase: 62 U/L (ref 39–117)
Potassium: 3.6 mEq/L (ref 3.5–5.3)
Sodium: 141 mEq/L (ref 135–145)
Total Protein: 6.9 g/dL (ref 6.0–8.3)

## 2011-07-23 LAB — CBC
MCHC: 31.7 g/dL (ref 30.0–36.0)
Platelets: 232 10*3/uL (ref 150–400)
RDW: 14.4 % (ref 11.5–15.5)
WBC: 3.1 10*3/uL — ABNORMAL LOW (ref 4.0–10.5)

## 2011-07-25 ENCOUNTER — Ambulatory Visit (INDEPENDENT_AMBULATORY_CARE_PROVIDER_SITE_OTHER): Payer: Medicare Other | Admitting: General Surgery

## 2011-07-25 VITALS — BP 120/76 | HR 68 | Temp 96.8°F | Wt 112.4 lb

## 2011-07-25 DIAGNOSIS — Z9889 Other specified postprocedural states: Secondary | ICD-10-CM

## 2011-07-25 DIAGNOSIS — Z9049 Acquired absence of other specified parts of digestive tract: Secondary | ICD-10-CM

## 2011-07-25 NOTE — Patient Instructions (Signed)
Increase your activity encourage walking and other physical activity including the dancing. I would encourage him breakfast and possible and is not any foods that she cannot eat some foods will cause more gas and abdominal cramping and increase your frequency of bowel movements

## 2011-07-25 NOTE — Progress Notes (Signed)
Subjective:     Patient ID: Anita Dickerson, female   DOB: 12-10-1946, 65 y.o.   MRN: [id number]  HPIThe patient returns now approximately 2 following resection of approximately the extubated of her distal small bowel from an infarction secondary to an adhesion from an old GYN surgery originally she feared that she was going to have a short bowel syndrome, and refused to eat feeling she would have diarrhea fortunately no colon was removed the small bowel was anastomosed to the cecum. She continues to have a lot of fixations or strictures of diet but has A list of her knee in the last few weeks and notes that she has usually 2 or or maybe 3 bowel movements a day z plate served chili of a normal food.   Review of Systems     Objective:   Physical ExamThe patient looks well her weight is still slightly lower than her preoperative weight but a CBC and see med were checked this week and her hematocrit now is 33 and discharged it was 22. Her electrolytes and kidney functions and liver function studies are all mole and she has oral iron was only taken it sporadically.  Her incision is well healed there is no fascia weakness and no localized abdominal tenderness    Assessment:    The patient is a Anita Dickerson nicely following her emergency surgery and is encouraged to resume normal activities include advanced and dancing. Her habits have always been not eat breakfast and I recommended that she do eat breakfast but appears that she sleeps approximately 11 AM. We checked a B12 level following her discharge and it was 330 which is low normal and appears Medicare does not pay for that test unless it's abnormal thank recorded in the level was needed patient understands that she'll probably need B12 shots along to a 3 times a year    Plan:     I strongly recommend increasing her physical activity is doing nicely on increasing her dietary intake and uses a Ensure or similar food supplement on a occasional basis.  Return in 3 months the patient requested a copy of her path report showed which she received and we again showed are all one diagrams where the small bowel  was anastomosed to the cecum.

## 2011-08-05 ENCOUNTER — Telehealth (INDEPENDENT_AMBULATORY_CARE_PROVIDER_SITE_OTHER): Payer: Self-pay

## 2011-08-05 NOTE — Telephone Encounter (Signed)
Husband called about lab bill for B12 that was discussed on last office

## 2011-10-14 ENCOUNTER — Encounter (INDEPENDENT_AMBULATORY_CARE_PROVIDER_SITE_OTHER): Payer: Self-pay | Admitting: General Surgery

## 2012-01-06 NOTE — Progress Notes (Signed)
THIS ENCOUNTER IS STUCK IN MY IN BASKET/UNABLE TO REMOVE/GY

## 2012-01-28 NOTE — Progress Notes (Signed)
SENDING QUICK NOTE TO DR. TOTH TO HELP WITH CLOSING THIS ENCOUNTER/GY

## 2012-01-28 NOTE — Progress Notes (Signed)
ANOTHER ATTEMPT TO ROUTE TO DR. TOTH/GY

## 2012-02-12 DIAGNOSIS — M545 Low back pain, unspecified: Secondary | ICD-10-CM | POA: Diagnosis not present

## 2012-02-12 DIAGNOSIS — K219 Gastro-esophageal reflux disease without esophagitis: Secondary | ICD-10-CM | POA: Diagnosis not present

## 2012-02-12 DIAGNOSIS — F4310 Post-traumatic stress disorder, unspecified: Secondary | ICD-10-CM | POA: Diagnosis not present

## 2012-02-12 DIAGNOSIS — Z1239 Encounter for other screening for malignant neoplasm of breast: Secondary | ICD-10-CM | POA: Diagnosis not present

## 2012-04-13 DIAGNOSIS — M545 Low back pain, unspecified: Secondary | ICD-10-CM | POA: Diagnosis not present

## 2012-04-13 DIAGNOSIS — F4310 Post-traumatic stress disorder, unspecified: Secondary | ICD-10-CM | POA: Diagnosis not present

## 2012-04-13 DIAGNOSIS — Z136 Encounter for screening for cardiovascular disorders: Secondary | ICD-10-CM | POA: Diagnosis not present

## 2012-04-13 DIAGNOSIS — K219 Gastro-esophageal reflux disease without esophagitis: Secondary | ICD-10-CM | POA: Diagnosis not present

## 2012-09-14 ENCOUNTER — Telehealth (INDEPENDENT_AMBULATORY_CARE_PROVIDER_SITE_OTHER): Payer: Self-pay | Admitting: General Surgery

## 2012-09-14 ENCOUNTER — Encounter (INDEPENDENT_AMBULATORY_CARE_PROVIDER_SITE_OTHER): Payer: Medicare Other | Admitting: Surgery

## 2012-09-14 NOTE — Telephone Encounter (Signed)
Patient called in stating she missed her appointment today with Dr. Gerrit Friends due to a family member that had to go to hospital. Patient rescheduled to 09/21/12 at 3:50 (o.k with Arline Asp). Patient stated she is having weakness and same problems she had before when she needed B12 injection. Advised the patient that she needs to discuss that with her PCP as that is not something that is handled in this office. Asked the patient if she is eating vegetables, fiber and taking multivitamins. Patient said yes (but there was a pause after asking about the multivitamin). Advised the patient to ask per PCP to check her mineral levels to determine if she needs additional supplements or not. Patient agreed.

## 2012-09-21 ENCOUNTER — Ambulatory Visit (INDEPENDENT_AMBULATORY_CARE_PROVIDER_SITE_OTHER): Payer: Medicare Other | Admitting: Surgery

## 2012-09-21 ENCOUNTER — Encounter (INDEPENDENT_AMBULATORY_CARE_PROVIDER_SITE_OTHER): Payer: Self-pay | Admitting: Surgery

## 2012-09-21 VITALS — BP 146/74 | HR 92 | Temp 97.3°F | Resp 16 | Ht 67.25 in

## 2012-09-21 DIAGNOSIS — Z9889 Other specified postprocedural states: Secondary | ICD-10-CM

## 2012-09-21 DIAGNOSIS — Z9049 Acquired absence of other specified parts of digestive tract: Secondary | ICD-10-CM

## 2012-09-21 DIAGNOSIS — L910 Hypertrophic scar: Secondary | ICD-10-CM

## 2012-09-21 HISTORY — DX: Hypertrophic scar: L91.0

## 2012-09-21 HISTORY — DX: Acquired absence of other specified parts of digestive tract: Z90.49

## 2012-09-21 MED ORDER — TRIAMCINOLONE ACETONIDE 0.025 % EX OINT: TOPICAL_OINTMENT | Freq: Two times a day (BID) | CUTANEOUS | Status: DC

## 2012-09-21 NOTE — Progress Notes (Signed)
[facility]  Visit Diagnoses: 1. History of resection of small bowel   2. Keloid scar, midline abdominal incision     HISTORY: Patient is a 66 year old black female who underwent exploratory laparotomy for small bowel obstruction in June of 2012. She required resection of her ileum and portion of her jejunum. She failed to return to see Dr. Zachery Dakins for followup. He was concerned about possible vitamin B12 deficiency do to resection of the terminal ileum. Patient now returns for evaluation.  PERTINENT REVIEW OF SYSTEMS: Patient denies weight loss. She denies any history of anemia. She complains about discomfort at the surgical incision on the lower midline of the abdominal wall. She does have occasional episodes of diarrhea.  EXAM: HEENT: normocephalic; pupils equal and reactive; sclerae clear; dentition good; mucous membranes moist NECK:  symmetric on extension; no palpable anterior or posterior cervical lymphadenopathy; no supraclavicular masses; no tenderness CHEST: clear to auscultation bilaterally without rales, rhonchi, or wheezes CARDIAC: regular rate and rhythm without significant murmur; peripheral pulses are full ABDOMEN: Thin, soft without distention; well-healed lower midline surgical incision with mild to moderate keloid formation in the scar. With Valsalva and cough there is no sign of herniation. There are no palpable masses. There is no significant tenderness. EXT:  non-tender without edema; no deformity NEURO: no gross focal deficits; no sign of tremor   IMPRESSION: Status post small bowel resection from internal hernia with volvulus June 2012  PLAN: Patient returns for followup. Her surgeon, Dr. Zachery Dakins, has retired. I have offered to check a complete blood count to make sure she is not persistently anemic. If she is anemic we will ask her primary care physician to begin B12 injections or regular basis. We will also check a complete  metabolic profile. I have given her a prescription for Kenalog ointment to apply to her surgical incision to see if that helps with the discomfort from her keloid formation.  Patient will return for followup as needed.  Velora Heckler, MD, [facility] Office: [phone]

## 2012-09-21 NOTE — Patient Instructions (Signed)
Apply steroid cream to incision 2 or 3 times daily.  Velora Heckler, MD, [facility] Office: [phone]

## 2012-09-22 DIAGNOSIS — Z9889 Other specified postprocedural states: Secondary | ICD-10-CM | POA: Diagnosis not present

## 2012-09-22 LAB — CBC
HCT: 30.1 % — ABNORMAL LOW (ref 34.0–46.6)
Hemoglobin: 10.5 g/dL — ABNORMAL LOW (ref 11.1–15.9)
RBC: 3.45 x10E6/uL — ABNORMAL LOW (ref 3.77–5.28)
RDW: 14.8 % (ref 12.3–15.4)
WBC: 3.5 10*3/uL (ref 3.4–10.8)

## 2012-09-23 ENCOUNTER — Telehealth (INDEPENDENT_AMBULATORY_CARE_PROVIDER_SITE_OTHER): Payer: Self-pay

## 2012-09-23 LAB — COMPREHENSIVE METABOLIC PANEL
Albumin: 4.3 g/dL (ref 3.6–4.8)
Alkaline Phosphatase: 63 IU/L (ref 47–112)
BUN/Creatinine Ratio: 14 (ref 11–26)
BUN: 11 mg/dL (ref 8–27)
CO2: 28 mmol/L (ref 19–28)
Creatinine, Ser: 0.78 mg/dL (ref 0.57–1.00)
Globulin, Total: 2.1 g/dL (ref 1.5–4.5)

## 2012-09-23 NOTE — Telephone Encounter (Signed)
Copy of CBC faxed to Dr Concepcion Elk for review and follow up.

## 2013-03-12 DIAGNOSIS — L819 Disorder of pigmentation, unspecified: Secondary | ICD-10-CM | POA: Diagnosis not present

## 2013-11-15 ENCOUNTER — Ambulatory Visit (INDEPENDENT_AMBULATORY_CARE_PROVIDER_SITE_OTHER): Payer: Medicare Other | Admitting: Family Medicine

## 2013-11-15 ENCOUNTER — Encounter: Payer: Self-pay | Admitting: Family Medicine

## 2013-11-15 VITALS — BP 128/80 | Temp 97.8°F | Ht 67.25 in | Wt 114.0 lb

## 2013-11-15 DIAGNOSIS — D649 Anemia, unspecified: Secondary | ICD-10-CM | POA: Diagnosis not present

## 2013-11-15 DIAGNOSIS — Z9049 Acquired absence of other specified parts of digestive tract: Secondary | ICD-10-CM

## 2013-11-15 DIAGNOSIS — E785 Hyperlipidemia, unspecified: Secondary | ICD-10-CM

## 2013-11-15 DIAGNOSIS — Z7189 Other specified counseling: Secondary | ICD-10-CM | POA: Diagnosis not present

## 2013-11-15 DIAGNOSIS — Z9889 Other specified postprocedural states: Secondary | ICD-10-CM | POA: Diagnosis not present

## 2013-11-15 DIAGNOSIS — Z87828 Personal history of other (healed) physical injury and trauma: Secondary | ICD-10-CM

## 2013-11-15 DIAGNOSIS — E538 Deficiency of other specified B group vitamins: Secondary | ICD-10-CM | POA: Diagnosis not present

## 2013-11-15 DIAGNOSIS — R443 Hallucinations, unspecified: Secondary | ICD-10-CM

## 2013-11-15 DIAGNOSIS — Z7689 Persons encountering health services in other specified circumstances: Secondary | ICD-10-CM

## 2013-11-15 LAB — CBC WITH DIFFERENTIAL/PLATELET
Basophils Relative: 0.7 % (ref 0.0–3.0)
Eosinophils Absolute: 0.1 10*3/uL (ref 0.0–0.7)
Hemoglobin: 11.5 g/dL — ABNORMAL LOW (ref 12.0–15.0)
Lymphs Abs: 1.8 10*3/uL (ref 0.7–4.0)
MCHC: 33 g/dL (ref 30.0–36.0)
MCV: 91.7 fl (ref 78.0–100.0)
Monocytes Absolute: 0.3 10*3/uL (ref 0.1–1.0)
Neutro Abs: 1.7 10*3/uL (ref 1.4–7.7)
RBC: 3.79 Mil/uL — ABNORMAL LOW (ref 3.87–5.11)

## 2013-11-15 LAB — BASIC METABOLIC PANEL
BUN: 8 mg/dL (ref 6–23)
Creatinine, Ser: 0.8 mg/dL (ref 0.4–1.2)
GFR: 97.31 mL/min (ref >60.00)

## 2013-11-15 LAB — LIPID PANEL: Cholesterol: 166 mg/dL (ref 0–200)

## 2013-11-15 NOTE — Patient Instructions (Signed)
-  We placed a referral for you as discussed. It usually takes about 1-2 weeks to process and schedule this referral. If you have not heard from [place] regarding this appointment in 2 weeks please contact our office.  -We have ordered labs or studies at this visit. It can take up to 1-2 weeks for results and processing. We will contact you with instructions IF your results are abnormal. Normal results will be released to your [facility]. If you have not heard from [place] or can not find your results in [facility] in 2 weeks please contact our office.  -PLEASE SIGN UP FOR MYCHART TODAY   We recommend the following healthy lifestyle measures: - eat a healthy diet consisting of lots of vegetables, fruits, beans, nuts, seeds, healthy meats such as white chicken and fish and whole grains.  - avoid fried foods, fast food, processed foods, sodas, red meet and other fattening foods.  - get a least 150 minutes of aerobic exercise per week.   Follow up in: 1 month for physical

## 2013-11-15 NOTE — Progress Notes (Signed)
Pre visit review using our clinic review tool, if applicable. No additional management support is needed unless otherwise documented below in the visit note. 

## 2013-11-15 NOTE — Progress Notes (Signed)
Chief Complaint  Patient presents with  . Establish Care    HPI:  Anita Dickerson is here to establish care. Wanted to get care with [place] because sees Community education officer. Last PCP and physical: wants to set this up as reports has been several years  Has the following chronic problems and concerns today:  Patient Active Problem List   Diagnosis Date Noted  . 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 Keloid scar, midline abdominal incision 09/21/2012   Hx of small bowel resection and wants to check labs - B12 in particular  Hallucinations, hx head injury -sees images of people at night when closes eyes, no auditory hallucinations, does not see images at other times, wonders if from god but due to hx wants to see specialist and wonders if needs repeat imaging of head -hx of head injury and imaging of head in the past -wants to see neurologist -denies any other neurological symptoms   Health Maintenance: -she will schedule  physical  ROS: See pertinent positives and negatives per HPI.  Past Medical History  Diagnosis Date  . Pain   . Diarrhea   . Depression     Family History  Problem Relation Age of Onset  . Heart disease Mother   . Diabetes Father   . Kidney failure Father   . Diabetes Sister   . Hypertension Sister   . Heart disease Sister     History   Social History  . Marital Status: Married    Spouse Name: N/A    Number of Children: N/A  . Years of Education: N/A   Social History Main Topics  . Smoking status: Never Smoker   . Smokeless tobacco: None  . Alcohol Use: No  . Drug Use: No  . Sexual Activity: None   Other Topics Concern  . 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  EXAM:  Filed  Vitals:   11/15/13 1128  BP: 128/80  Temp: 97.8 F (36.6 C)    Body mass index is 17.73 kg/(m^2).  GENERAL: vitals reviewed and listed above, alert, oriented, appears well hydrated and in no acute distress  HEENT: atraumatic, conjunttiva clear, no obvious abnormalities on inspection of external nose and ears  NECK: no obvious masses on inspection  LUNGS: clear to auscultation bilaterally, no wheezes, rales or rhonchi, good air movement  CV: HRRR, no peripheral edema  MS: moves all extremities without noticeable abnormality  PSYCH: pleasant and cooperative, no obvious depression or anxiety, speaks passionately about ministry and miracles  ASSESSMENT AND PLAN:  Discussed the following assessment and plan:  Anemia - Plan: CBC with Differential  History of resection of small bowel  Vitamin B12 deficiency - Plan: Vitamin B12  Encounter to establish care - Plan: Basic metabolic panel, Lipid Panel  Hallucination - Plan: Ambulatory referral to Neurology  History of head injury - Plan: Ambulatory referral to Neurology  Other and unspecified hyperlipidemia - Plan: Lipid Panel  B12 deficiency  -We reviewed the PMH, PSH, FH, SH, Meds and Allergies. -We provided refills for any medications we will prescribe as needed. -We addressed current concerns per orders and patient instructions. -  We have asked for records for pertinent exams, studies, vaccines and notes from previous providers. -We have advised patient to follow up per instructions below. -pt arrived very late for her appointment, thus we did not have long for her visit. We will order some labs and placed the referral she requested and will have her follow up soon to review labs and have her physical. I am concerned with her reports of possible hallucinations and a possible neuro or psych dx but had little time to pursue this. She will see neurology and we will discuss further at follow up.  -Patient advised to return or  notify a doctor immediately if symptoms worsen or persist or new concerns arise.  Patient Instructions  -We placed a referral for you as discussed. It usually takes about 1-2 weeks to process and schedule this referral. If you have not heard from [place] regarding this appointment in 2 weeks please contact our office.  -We have ordered labs or studies at this visit. It can take up to 1-2 weeks for results and processing. We will contact you with instructions IF your results are abnormal. Normal results will be released to your [facility]. If you have not heard from [place] or can not find your results in [facility] in 2 weeks please contact our office.  -PLEASE SIGN UP FOR MYCHART TODAY   We recommend the following healthy lifestyle measures: - eat a healthy diet consisting of lots of vegetables, fruits, beans, nuts, seeds, healthy meats such as white chicken and fish and whole grains.  - avoid fried foods, fast food, processed foods, sodas, red meet and other fattening foods.  - get a least 150 minutes of aerobic exercise per week.   Follow up in: 1 month for physical        KIM, HANNAH R.

## 2013-11-18 ENCOUNTER — Ambulatory Visit (INDEPENDENT_AMBULATORY_CARE_PROVIDER_SITE_OTHER): Payer: Medicare Other

## 2013-11-18 DIAGNOSIS — E538 Deficiency of other specified B group vitamins: Secondary | ICD-10-CM | POA: Diagnosis not present

## 2013-11-18 MED ORDER — CYANOCOBALAMIN 1000 MCG/ML IJ SOLN
1000.0000 ug | Freq: Once | INTRAMUSCULAR | Status: AC
  Administered 2013-11-18: 1000 ug via INTRAMUSCULAR

## 2013-11-19 NOTE — Progress Notes (Signed)
Pt came into office on 11/18/13 and labs were discussed.

## 2013-12-29 ENCOUNTER — Encounter: Payer: Medicare Other | Admitting: Family Medicine

## 2014-01-18 ENCOUNTER — Encounter: Payer: Medicare Other | Admitting: Family Medicine

## 2014-01-28 DIAGNOSIS — F411 Generalized anxiety disorder: Secondary | ICD-10-CM | POA: Diagnosis not present

## 2014-02-07 ENCOUNTER — Encounter: Payer: Medicare Other | Admitting: Family Medicine

## 2014-02-07 DIAGNOSIS — Z0289 Encounter for other administrative examinations: Secondary | ICD-10-CM

## 2014-02-07 NOTE — Progress Notes (Signed)
error    This encounter was created in error - please disregard.

## 2014-02-22 ENCOUNTER — Ambulatory Visit: Payer: Medicare Other | Admitting: Family Medicine

## 2014-04-28 ENCOUNTER — Ambulatory Visit (INDEPENDENT_AMBULATORY_CARE_PROVIDER_SITE_OTHER): Payer: Medicare Other | Admitting: Family Medicine

## 2014-04-28 ENCOUNTER — Encounter: Payer: Self-pay | Admitting: Family Medicine

## 2014-04-28 VITALS — BP 122/84 | HR 87 | Temp 98.0°F | Ht 67.25 in

## 2014-04-28 DIAGNOSIS — D61818 Other pancytopenia: Secondary | ICD-10-CM

## 2014-04-28 DIAGNOSIS — F419 Anxiety disorder, unspecified: Secondary | ICD-10-CM

## 2014-04-28 DIAGNOSIS — F341 Dysthymic disorder: Secondary | ICD-10-CM | POA: Diagnosis not present

## 2014-04-28 DIAGNOSIS — F329 Major depressive disorder, single episode, unspecified: Secondary | ICD-10-CM

## 2014-04-28 LAB — CBC WITH DIFFERENTIAL/PLATELET
BASOS ABS: 0 10*3/uL (ref 0.0–0.1)
Basophils Relative: 0.7 % (ref 0.0–3.0)
EOS ABS: 0.1 10*3/uL (ref 0.0–0.7)
Eosinophils Relative: 1.9 % (ref 0.0–5.0)
HCT: 34.8 % — ABNORMAL LOW (ref 36.0–46.0)
HEMOGLOBIN: 11.6 g/dL — AB (ref 12.0–15.0)
LYMPHS PCT: 53.6 % — AB (ref 12.0–46.0)
Lymphs Abs: 2.4 10*3/uL (ref 0.7–4.0)
MCHC: 33.2 g/dL (ref 30.0–36.0)
MCV: 92.8 fl (ref 78.0–100.0)
Monocytes Absolute: 0.5 10*3/uL (ref 0.1–1.0)
Monocytes Relative: 11.2 % (ref 3.0–12.0)
NEUTROS ABS: 1.5 10*3/uL (ref 1.4–7.7)
Neutrophils Relative %: 32.6 % — ABNORMAL LOW (ref 43.0–77.0)
PLATELETS: 181 10*3/uL (ref 150.0–400.0)
RBC: 3.75 Mil/uL — ABNORMAL LOW (ref 3.87–5.11)
RDW: 15.3 % (ref 11.5–15.5)
WBC: 4.5 10*3/uL (ref 4.0–10.5)

## 2014-04-28 NOTE — Patient Instructions (Addendum)
I advise you to see a psychiatrist. I have provided you with a number of options and advise you to call and set up an appointment.  I placed a referral to the hematologist regarding your lower blood counts.  FOR IMPROVED SLEEP AND TO RESET YOUR SLEEP SCHEDULE: []  exercise 30 minutes daily  []  go to bed and wake up at the same time  []  keep bedroom cool, dark and quiet  []  reserve bed for sleep - do not read, watch TV, etc in bed  []  If you toss and turn more then 15-20 minutes get out of bed and list thoughts/do quite activity then go back to bed; repeat as needed; do not worry about when you eventually fall asleep - still get up at the same time and turn on lights and take shower  [] get counseling  []  some people find that a half dose of benadryl, melatonin, tylenol pm or unisom on a few nights per week is helpful initially for a few weeks  [] seek help and treat any depression or anxiety  [] prescription strength sleep medications should only be used in severe cases of insomnia if other measures fail and should be used sparingly  FOR YOUR ANXIETY, STRESS or DEPRESSION:  []  Seek counseling - this is as effective as medications and will help to get at the root of the imbalance. Please use the number provided to set up an appointment.  []  Ensure AT LEAST 150 minutes of cardiovascular exercise per week - 30 minutes of sweaty exercise daily is best.  []  Set a schedule that includes adequate time for sleep, fun activities and exercise.  []  Medications are best used short term while finding a healthier more balanced life that promotes good emotional and mental health. I do not prescribe sleep medications such as Ambien, etc. or controlled anxiety medications such as Xanax, Klonopin, etc. long term in adult patients and will have you see a psychiatrist if these types of medications are required on more then a temporary basis.

## 2014-04-28 NOTE — Progress Notes (Addendum)
No chief complaint on file.   HPI:  Anxiety/Depression/trouble sleeping: -had traumatic experience in Feb - falsely accused of stealing a teddy bear from store - but had paid for it, church person saw it - and people are talking at church, detained, embarrassed -has broken hx written on paper as she reports has laryngitis and can't talk today -denies: SI, thoughts of self harm, hallucination -she wants to see psychiatrist  Note: I have seen her once and as a courtesy as she arrived very late after her appt time. At the time she reported hallucinations and refused psych referral and wanted neuro referral which I placed. She was to follow up closely - but did not follow this recommendation. She also was advised to schedule a physical. She also has a chronic pancytopenia, low B12 and was advised to follow up to discuss, but did not.  ROS: See pertinent positives and negatives per HPI.  Past Medical History  Diagnosis Date  . Pain   . Diarrhea   . Depression     Past Surgical History  Procedure Laterality Date  . Colon surgery    . Small intestine surgery    . Myomectomy  1997-1998  . Hand surgery    . Toe surgery      Family History  Problem Relation Age of Onset  . Heart disease Mother   . Diabetes Father   . Kidney failure Father   . Diabetes Sister   . Hypertension Sister   . Heart disease Sister     History   Social History  . Marital Status: Married    Spouse Name: N/A    Number of Children: N/A  . Years of Education: N/A   Social History Main Topics  . Smoking status: Never Smoker   . Smokeless tobacco: None  . Alcohol Use: No  . Drug Use: No  . Sexual Activity: None   Other Topics Concern  . 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  EXAMDanley Danker Vitals:   04/28/14 0842  BP: 122/84  Pulse: 87  Temp: 98 F (36.7 C)    Body mass index is 0.00 kg/(m^2).  GENERAL: vitals reviewed and listed above, alert, oriented, appears well hydrated and in no acute distress  HEENT: atraumatic, conjunttiva clear, no obvious abnormalities on inspection of external nose and ears  NECK: no obvious masses on inspection  LUNGS: clear to auscultation bilaterally, no wheezes, rales or rhonchi, good air movement  CV: HRRR, no peripheral edema  MS: moves all extremities without noticeable abnormality  PSYCH: pleasant and cooperative, no obvious depression or anxiety  ASSESSMENT AND PLAN:  Discussed the following assessment and plan:  Pancytopenia - Plan: CBC with Differential  Anxiety and depression - Plan: Ambulatory referral to Psychiatry  -psych for sig psych issues and referred and provided number for psychiatrist, advised she reschedule neuro appt  -advised she see hematologist for pancytopenia if still present on labs today -advised per instructions for anxiety and sleep in the interim -advised physical -advised if does not follow recommendations, then I will advise her to find another physician for her care -Patient advised to return or  notify a doctor immediately if symptoms worsen or persist or new concerns arise.   Patient Instructions  I advise you to see a psychiatrist. I have provided you with a number of options and advise you to call and set up an appointment.  I placed a referral to the hematologist regarding your lower blood counts.  FOR IMPROVED SLEEP AND TO RESET YOUR SLEEP SCHEDULE: []  exercise 30 minutes daily  []  go to bed and wake up at the same time  []  keep bedroom cool, dark and quiet  []  reserve bed for sleep - do not read, watch TV, etc in bed  []  If you toss and turn more then 15-20 minutes get out of bed and list thoughts/do quite activity then go back to bed;  repeat as needed; do not worry about when you eventually fall asleep - still get up at the same time and turn on lights and take shower  [] get counseling  []  some people find that a half dose of benadryl, melatonin, tylenol pm or unisom on a few nights per week is helpful initially for a few weeks  [] seek help and treat any depression or anxiety  [] prescription strength sleep medications should only be used in severe cases of insomnia if other measures fail and should be used sparingly  FOR YOUR ANXIETY, STRESS or DEPRESSION:  []  Seek counseling - this is as effective as medications and will help to get at the root of the imbalance. Please use the number provided to set up an appointment.  []  Ensure AT LEAST 150 minutes of cardiovascular exercise per week - 30 minutes of sweaty exercise daily is best.  []  Set a schedule that includes adequate time for sleep, fun activities and exercise.  []  Medications are best used short term while finding a healthier more balanced life that promotes good emotional and mental health. I do not prescribe sleep medications such as Ambien, etc. or controlled anxiety medications such as Xanax, Klonopin, etc. long term in adult patients and will have you see a psychiatrist if these types of medications are required on more then a temporary basis.         Lucretia Kern

## 2014-04-28 NOTE — Progress Notes (Signed)
Pre visit review using our clinic review tool, if applicable. No additional management support is needed unless otherwise documented below in the visit note. 

## 2014-05-06 ENCOUNTER — Ambulatory Visit (INDEPENDENT_AMBULATORY_CARE_PROVIDER_SITE_OTHER): Payer: Medicare Other | Admitting: Family Medicine

## 2014-05-06 DIAGNOSIS — E538 Deficiency of other specified B group vitamins: Secondary | ICD-10-CM

## 2014-05-06 MED ORDER — CYANOCOBALAMIN 1000 MCG/ML IJ SOLN
1000.0000 ug | Freq: Once | INTRAMUSCULAR | Status: AC
Start: 2014-05-06 — End: 2014-05-06
  Administered 2014-05-06: 1000 ug via INTRAMUSCULAR

## 2014-05-13 ENCOUNTER — Ambulatory Visit (INDEPENDENT_AMBULATORY_CARE_PROVIDER_SITE_OTHER): Payer: Medicare Other | Admitting: *Deleted

## 2014-05-13 DIAGNOSIS — E538 Deficiency of other specified B group vitamins: Secondary | ICD-10-CM | POA: Diagnosis not present

## 2014-05-13 DIAGNOSIS — R7989 Other specified abnormal findings of blood chemistry: Secondary | ICD-10-CM

## 2014-05-13 MED ORDER — CYANOCOBALAMIN 1000 MCG/ML IJ SOLN
1000.0000 ug | Freq: Once | INTRAMUSCULAR | Status: AC
Start: 2014-05-13 — End: 2014-05-13
  Administered 2014-05-13: 1000 ug via INTRAMUSCULAR

## 2014-05-20 ENCOUNTER — Ambulatory Visit (INDEPENDENT_AMBULATORY_CARE_PROVIDER_SITE_OTHER): Payer: Medicare Other | Admitting: *Deleted

## 2014-05-20 DIAGNOSIS — E538 Deficiency of other specified B group vitamins: Secondary | ICD-10-CM | POA: Diagnosis not present

## 2014-05-20 MED ORDER — CYANOCOBALAMIN 1000 MCG/ML IJ SOLN
1000.0000 ug | Freq: Once | INTRAMUSCULAR | Status: AC
  Administered 2014-05-20: 1000 ug via INTRAMUSCULAR

## 2014-05-27 ENCOUNTER — Ambulatory Visit (INDEPENDENT_AMBULATORY_CARE_PROVIDER_SITE_OTHER): Payer: Medicare Other | Admitting: Family Medicine

## 2014-05-27 ENCOUNTER — Encounter: Payer: Self-pay | Admitting: Family Medicine

## 2014-05-27 ENCOUNTER — Ambulatory Visit (INDEPENDENT_AMBULATORY_CARE_PROVIDER_SITE_OTHER): Payer: Medicare Other | Admitting: *Deleted

## 2014-05-27 VITALS — BP 122/82 | HR 84 | Temp 97.6°F | Ht 67.25 in | Wt 132.5 lb

## 2014-05-27 DIAGNOSIS — E538 Deficiency of other specified B group vitamins: Secondary | ICD-10-CM

## 2014-05-27 DIAGNOSIS — F411 Generalized anxiety disorder: Secondary | ICD-10-CM

## 2014-05-27 MED ORDER — CYANOCOBALAMIN 1000 MCG/ML IJ SOLN
1000.0000 ug | Freq: Once | INTRAMUSCULAR | Status: AC
  Administered 2014-05-27: 1000 ug via INTRAMUSCULAR

## 2014-05-27 NOTE — Progress Notes (Signed)
Pre visit review using our clinic review tool, if applicable. No additional management support is needed unless otherwise documented below in the visit note. 

## 2014-05-27 NOTE — Progress Notes (Signed)
No chief complaint on file.   HPI:  Anxiety: -reports continues to worry about the situation with teddy bear when accused of stealing -passed store where this happened on the way here to get b12 inj and got upset and anxious and feels had panic attack with heart racing, this has ruined her reputation per her report -reports satan is out to get her and feels like needs to flush out this feeling -reports set up psychiatry appointment for next week -feeling better now -no thoughts of self harm or depression per her report -denies: CP, SOb, swelling, fevers  ROS: See pertinent positives and negatives per HPI.  Past Medical History  Diagnosis Date  . Pain   . Diarrhea   . Depression     Past Surgical History  Procedure Laterality Date  . Colon surgery    . Small intestine surgery    . Myomectomy  1997-1998  . Hand surgery    . Toe surgery      Family History  Problem Relation Age of Onset  . Heart disease Mother   . Diabetes Father   . Kidney failure Father   . Diabetes Sister   . Hypertension Sister   . Heart disease Sister     History   Social History  . Marital Status: Married    Spouse Name: N/A    Number of Children: N/A  . Years of Education: N/A   Social History Main Topics  . Smoking status: Never Smoker   . Smokeless tobacco: None  . Alcohol Use: No  . Drug Use: No  . Sexual Activity: None   Other Topics Concern  . 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  GENERAL: vitals reviewed  and listed above, alert, oriented, appears well hydrated and in no acute distress  HEENT: atraumatic, conjunttiva clear, no obvious abnormalities on inspection of external nose and ears  NECK: no obvious masses on inspection  LUNGS: clear to auscultation bilaterally, no wheezes, rales or rhonchi, good air movement  CV: HRRR, no peripheral edema  MS: moves all extremities without noticeable abnormality  PSYCH: pleasant and cooperative, no obvious depression or anxiety  ASSESSMENT AND PLAN:  Discussed the following assessment and plan:  Anxiety state, unspecified  -she is feeling better now, have advised her to see psychiatry and she reports she is seeing them next week - reports she scheduled her own appointment but doe snot know nme of doctor, emergent and return precuations -advised she call us to let us know whom she is seeing -Patient advised to return or notify a doctor immediately if symptoms worsen or persist or new concerns arise.  Patient Instructions  -see your psychiatrist as scheduled or see doctor immediately if worsening or not improving  -schedule physical exam and will recheck labs then     Colin Benton R.

## 2014-05-27 NOTE — Patient Instructions (Signed)
-  see your psychiatrist as scheduled or see doctor immediately if worsening or not improving  -schedule physical exam and will recheck labs then

## 2014-06-09 ENCOUNTER — Ambulatory Visit (HOSPITAL_COMMUNITY): Payer: Medicare Other | Admitting: Psychiatry

## 2014-07-07 DIAGNOSIS — T148XXA Other injury of unspecified body region, initial encounter: Secondary | ICD-10-CM | POA: Diagnosis not present

## 2014-07-07 DIAGNOSIS — R030 Elevated blood-pressure reading, without diagnosis of hypertension: Secondary | ICD-10-CM | POA: Diagnosis not present

## 2014-07-11 DIAGNOSIS — I10 Essential (primary) hypertension: Secondary | ICD-10-CM | POA: Diagnosis not present

## 2014-07-11 DIAGNOSIS — T148XXA Other injury of unspecified body region, initial encounter: Secondary | ICD-10-CM | POA: Diagnosis not present

## 2014-07-12 DIAGNOSIS — F411 Generalized anxiety disorder: Secondary | ICD-10-CM | POA: Diagnosis not present

## 2014-07-27 DIAGNOSIS — F411 Generalized anxiety disorder: Secondary | ICD-10-CM | POA: Diagnosis not present

## 2014-08-10 DIAGNOSIS — F411 Generalized anxiety disorder: Secondary | ICD-10-CM | POA: Diagnosis not present

## 2014-08-10 DIAGNOSIS — F329 Major depressive disorder, single episode, unspecified: Secondary | ICD-10-CM | POA: Diagnosis not present

## 2014-09-01 DIAGNOSIS — E639 Nutritional deficiency, unspecified: Secondary | ICD-10-CM | POA: Diagnosis not present

## 2014-09-01 DIAGNOSIS — F411 Generalized anxiety disorder: Secondary | ICD-10-CM | POA: Diagnosis not present

## 2014-09-01 DIAGNOSIS — F40298 Other specified phobia: Secondary | ICD-10-CM | POA: Diagnosis not present

## 2014-09-01 DIAGNOSIS — E559 Vitamin D deficiency, unspecified: Secondary | ICD-10-CM | POA: Diagnosis not present

## 2014-09-02 ENCOUNTER — Ambulatory Visit (INDEPENDENT_AMBULATORY_CARE_PROVIDER_SITE_OTHER): Payer: Medicare Other | Admitting: Family Medicine

## 2014-09-02 ENCOUNTER — Encounter: Payer: Medicare Other | Admitting: Family Medicine

## 2014-09-02 ENCOUNTER — Encounter: Payer: Self-pay | Admitting: Family Medicine

## 2014-09-02 VITALS — BP 122/84 | HR 84 | Temp 98.0°F | Ht 67.25 in | Wt 133.0 lb

## 2014-09-02 DIAGNOSIS — R633 Feeding difficulties, unspecified: Secondary | ICD-10-CM | POA: Diagnosis not present

## 2014-09-02 DIAGNOSIS — D649 Anemia, unspecified: Secondary | ICD-10-CM | POA: Diagnosis not present

## 2014-09-02 DIAGNOSIS — F341 Dysthymic disorder: Secondary | ICD-10-CM

## 2014-09-02 DIAGNOSIS — R6339 Other feeding difficulties: Secondary | ICD-10-CM

## 2014-09-02 DIAGNOSIS — F419 Anxiety disorder, unspecified: Secondary | ICD-10-CM

## 2014-09-02 DIAGNOSIS — F32A Depression, unspecified: Secondary | ICD-10-CM

## 2014-09-02 DIAGNOSIS — F329 Major depressive disorder, single episode, unspecified: Secondary | ICD-10-CM

## 2014-09-02 NOTE — Progress Notes (Signed)
Pre visit review using our clinic review tool, if applicable. No additional management support is needed unless otherwise documented below in the visit note. 

## 2014-09-02 NOTE — Progress Notes (Signed)
Error   This encounter was created in error - please disregard. 

## 2014-09-02 NOTE — Patient Instructions (Signed)
-  advise you see Dr. Bridgett Larsson as scheduled and sooner if needed for treatment of the anxiety  -advise a healthy diet with 3 regular meals per day and snacks - can add boost or ensure with meals if anxiety is preventing consumption of solid food - can prepare your meals at home and then blend until anxiety controlled if needed  -we will work on getting records from Dr. Bridgett Larsson and from the urgent care  -follow up in 1 month for physical exam and labs

## 2014-09-02 NOTE — Progress Notes (Signed)
No chief complaint on file.   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 -reports she is very anxious all the time, anxious around people, anxious to go out, anxious to eat due to incident below - eating blended foods, smoothies -feels she should not be anxious given she is a good follower of christ -has vivid dreams, denies hallucinations at this time -denies SI or thoughts of self harm or harm to others -very embarrassed that she is seeing a psychiatrist and having anxiety issues  2) Cut in mouth: -bit bone in greens at [employer] and injured gum -did not swallow it - seen in urgent care and got tdap - reports had reaction to tdap with rash and dizziness -reports seen in urgent care for reaction and given prednisone -mouth is healed and reaction resolved, but now she is afraid to eat anything because she is afraid of bones in everything -reports had a bunch of blood drawn at urgent care when followed up yesterday so does not want to do labs today -she denies: sore mouth, malaise, rash now, SOB, weight loss, concern with body image, binging or purging, eating disorder - but afraid to eat solid food due to anxiety and knows this is excessive  3)Chronic normocytic anemia:  -for > 3 years  -low B12, on treatment  -poor follow up with treatment and recommendations  -repeat CBC today, retic, anemia panel, B12 today advised - but she thinks all labs done at urgent care and prefers not to repeat -denies: bleeding, melena, fevers, weight loss  ROS: See pertinent positives and negatives per HPI.  Past Medical History  Diagnosis Date  . Pain   .  Diarrhea   . Depression     Past Surgical History  Procedure Laterality Date  . Colon surgery    . Small intestine surgery    . Myomectomy  1997-1998  . Hand surgery    . Toe surgery      Family History  Problem Relation Age of Onset  . Heart disease Mother   . Diabetes Father   . Kidney failure Father   . Diabetes Sister   . Hypertension Sister   . Heart disease Sister     History   Social History  . Marital Status: Married    Spouse Name: N/A    Number of Children: N/A  . Years of Education: N/A   Social History Main Topics  . Smoking status: Never Smoker   . Smokeless tobacco: None  . Alcohol Use: No  . Drug Use: No  . Sexual Activity: None   Other Topics Concern  . 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  EXAM:  Filed Vitals:   09/02/14 1342  BP: 122/84  Pulse: 84  Temp: 98 F (36.7 C)  Body mass index is 20.68 kg/(m^2).  GENERAL: thin AA F in no acute distress, vitals reviewed and listed above, alert, oriented, appears well hydrated and in no acute distress  HEENT: atraumatic, conjunttiva clear, no obvious abnormalities on inspection of external nose and ears  NECK: no obvious masses on inspection  LUNGS: clear to auscultation bilaterally, no wheezes, rales or rhonchi, good air movement  CV: HRRR, no peripheral edema  MS: moves all extremities without noticeable abnormality  PSYCH: pleasant and cooperative, difficult to follow in conversation at times, anxious, odd appearance - in her usual attire which includes very short skirt, bright orange trench coat, stockings and flashy shoes  ASSESSMENT AND PLAN:  Discussed the following assessment and plan:  Anxiety and depression  Food  aversion  Anemia, unspecified anemia type  -advised she take all medications as directed by her psychiatrist and not stop any without his supervision -advised boost or ensure with each meal if not eating solid foods  -advised labs but she reports she had these yesterday at urgent care - she did not know the name of the clinic - we looked it up on the map and assistant call to obtain records -advised assistant to obtain records from psych -close follow up for physical exam, may need neuro eval as well given memory issues, though feels issue more psychiatric in nature -Patient advised to return or notify a doctor immediately if symptoms worsen or persist or new concerns arise.  Patient Instructions  -advise you see Dr. Bridgett Larsson as scheduled and sooner if needed for treatment of the anxiety  -advise a healthy diet with 3 regular meals per day and snacks - can add boost or ensure with meals if anxiety is preventing consumption of solid food - can prepare your meals at home and then blend until anxiety controlled if needed  -we will work on getting records from Dr. Bridgett Larsson and from the urgent care  -follow up in 1 month for physical exam and labs     [place], [place]

## 2014-09-09 ENCOUNTER — Encounter: Payer: Self-pay | Admitting: Family Medicine

## 2014-09-09 NOTE — Progress Notes (Signed)
Labs from [facility] care received. CMP, CBC with diff, urine, TSH and Vit D. All unremarkable except for mildly low Vit. D - sent to scan box.

## 2014-09-22 DIAGNOSIS — F411 Generalized anxiety disorder: Secondary | ICD-10-CM | POA: Diagnosis not present

## 2014-09-22 DIAGNOSIS — F4322 Adjustment disorder with anxiety: Secondary | ICD-10-CM | POA: Diagnosis not present

## 2014-10-04 ENCOUNTER — Encounter: Payer: Medicare Other | Admitting: Family Medicine

## 2014-10-27 ENCOUNTER — Encounter: Payer: Medicare Other | Admitting: Family Medicine

## 2014-11-24 DIAGNOSIS — F4322 Adjustment disorder with anxiety: Secondary | ICD-10-CM | POA: Diagnosis not present

## 2014-11-24 DIAGNOSIS — F411 Generalized anxiety disorder: Secondary | ICD-10-CM | POA: Diagnosis not present

## 2015-02-06 DIAGNOSIS — F411 Generalized anxiety disorder: Secondary | ICD-10-CM | POA: Diagnosis not present

## 2015-02-06 DIAGNOSIS — F4322 Adjustment disorder with anxiety: Secondary | ICD-10-CM | POA: Diagnosis not present

## 2015-02-13 ENCOUNTER — Encounter: Payer: Self-pay | Admitting: Family Medicine

## 2015-02-13 ENCOUNTER — Ambulatory Visit (INDEPENDENT_AMBULATORY_CARE_PROVIDER_SITE_OTHER): Payer: Medicare Other | Admitting: Family Medicine

## 2015-02-13 VITALS — BP 122/80 | HR 89 | Temp 97.7°F | Ht 67.25 in

## 2015-02-13 DIAGNOSIS — E785 Hyperlipidemia, unspecified: Secondary | ICD-10-CM

## 2015-02-13 DIAGNOSIS — R739 Hyperglycemia, unspecified: Secondary | ICD-10-CM | POA: Diagnosis not present

## 2015-02-13 DIAGNOSIS — F32A Depression, unspecified: Secondary | ICD-10-CM | POA: Insufficient documentation

## 2015-02-13 DIAGNOSIS — Z1211 Encounter for screening for malignant neoplasm of colon: Secondary | ICD-10-CM

## 2015-02-13 DIAGNOSIS — F329 Major depressive disorder, single episode, unspecified: Secondary | ICD-10-CM | POA: Diagnosis not present

## 2015-02-13 DIAGNOSIS — E538 Deficiency of other specified B group vitamins: Secondary | ICD-10-CM | POA: Diagnosis not present

## 2015-02-13 DIAGNOSIS — D649 Anemia, unspecified: Secondary | ICD-10-CM | POA: Diagnosis not present

## 2015-02-13 DIAGNOSIS — Z9889 Other specified postprocedural states: Secondary | ICD-10-CM | POA: Diagnosis not present

## 2015-02-13 DIAGNOSIS — IMO0001 Reserved for inherently not codable concepts without codable children: Secondary | ICD-10-CM

## 2015-02-13 DIAGNOSIS — Z9049 Acquired absence of other specified parts of digestive tract: Secondary | ICD-10-CM

## 2015-02-13 DIAGNOSIS — R030 Elevated blood-pressure reading, without diagnosis of hypertension: Secondary | ICD-10-CM | POA: Diagnosis not present

## 2015-02-13 LAB — VITAMIN B12: VITAMIN B 12: 218 pg/mL (ref 211–911)

## 2015-02-13 LAB — LIPID PANEL
CHOL/HDL RATIO: 2
Cholesterol: 192 mg/dL (ref 0–200)
HDL: 83.2 mg/dL (ref >39.00)
LDL CALC: 99 mg/dL (ref 0–99)
NONHDL: 108.8
Triglycerides: 49 mg/dL (ref 0.0–149.0)
VLDL: 9.8 mg/dL (ref 0.0–40.0)

## 2015-02-13 LAB — HEMOGLOBIN A1C: HEMOGLOBIN A1C: 5.9 % (ref 4.6–6.5)

## 2015-02-13 NOTE — Progress Notes (Addendum)
HPI:  Anita Dickerson is a 69 yo F with significant psychiatric disease (seeing psych), unfortunately poorly compliant with recs and with frequent no shows here for an acute visit for:  Elevated BP: -reports BP was up at her psychiatrist office and was advised to see me -reports she was very anxious at the point -reports her psychiatrist told her that the psych meds she was on could cause this and told her to stop them for now - she does not know the names of her medications and has not taken for 1 month -denies: CP, SOB, swelling  Depression/Anxiety: -seeing Dr. Bridgett Larsson in [place] for this -reports he wanted her to have some labs for her BP (b12, lipids, hgba1c - on rx from her psychiatrist) -denies: SI, worsening depression  Note: labs scanned from outside clinic from 08/2014 with CMP, CBC with diff, UA, TSH, all normal, Vit D mildly low  ROS: See pertinent positives and negatives per HPI.  Past Medical History  Diagnosis Date  . Depression   . 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 Vitamin B 12 deficiency     Past Surgical History  Procedure Laterality Date  . Colon surgery    . Small intestine surgery      ileum/jej for sbo  . Myomectomy  1997-1998  . Hand surgery    . Toe surgery      Family History  Problem Relation Age of Onset  . Heart disease Mother   . Diabetes Father   . Kidney failure Father   . Diabetes Sister   . Hypertension Sister   . Heart disease Sister     History   Social History  . Marital Status: Married    Spouse Name: N/A  . Number of Children: N/A  . Years of Education: N/A   Social History Main Topics  . Smoking status: Never Smoker   . Smokeless tobacco: Not on file  . Alcohol Use: No  . Drug Use: No  . Sexual Activity: Not on file   Other Topics Concern  . 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  EXAM:  Filed Vitals:   02/13/15 1017  BP: 122/80  Pulse: 89  Temp: 97.7 F (36.5 C)    Body mass index is 0.00 kg/(m^2).  GENERAL: vitals reviewed and listed above, alert, oriented, appears well hydrated and in no acute distress  HEENT: atraumatic, conjunttiva clear, no obvious abnormalities on inspection of external nose and ears  NECK: no obvious masses on inspection  LUNGS: clear to auscultation bilaterally, no wheezes, rales or rhonchi, good air movement  CV: HRRR, no peripheral edema  MS: moves all extremities without noticeable abnormality  PSYCH: pleasant and cooperative, no obvious depression or anxiety  ASSESSMENT AND PLAN:  Discussed the following assessment and plan:  Depression and anxiety - sees psych - Plan: Lipid Panel, Hemoglobin A1c  Elevated blood pressure - Plan: Lipid Panel, Hemoglobin A1c  Hx of resection of small bowel  B12 deficiency - requested per psych as well, EPIC would not allow order, advised lab to draw and figure out how to  overide medicare stop and pt aware  Hyperlipemia - Plan: Lipid Panel  Hyperglycemia - Plan: Hemoglobin A1c  Anemia, unspecified anemia type  Colon cancer screening - Plan: Ambulatory referral to Gastroenterology  -FASTING labs today per her request from [facility] orders -advised assistant to have pt sign release form to fax to psychiatrist and request records faxed to [place] -all HM measures reviewed and offered, advised to schedule CPE -BP good today - advised to continue to monitor -Patient advised to return or notify a doctor immediately if symptoms worsen or persist or new concerns arise.  Patient Instructions  BEFORE YOU LEAVE: -labs -schedule physical in 3 months  Call today to schedule your mammogram - number on card we gave  you  Please let us know when you want to do the colonoscopy and the bone density test we advised and we will place a referral for this.  We recommend the following healthy lifestyle measures: - eat a healthy diet consisting of lots of vegetables, fruits, beans, nuts, seeds, healthy meats such as white chicken and fish and whole grains.  - avoid fried foods, fast food, processed foods, sodas, red meet and other fattening foods.  - get a least 150 minutes of aerobic exercise per week.        Colin Benton R.

## 2015-02-13 NOTE — Progress Notes (Signed)
Pre visit review using our clinic review tool, if applicable. No additional management support is needed unless otherwise documented below in the visit note. 

## 2015-02-13 NOTE — Patient Instructions (Addendum)
BEFORE YOU LEAVE: -labs -schedule physical in 3 months  Call today to schedule your mammogram - number on card we gave you  Please let us know when you want to do the colonoscopy and the bone density test we advised and we will place a referral for this.  We recommend the following healthy lifestyle measures: - eat a healthy diet consisting of lots of vegetables, fruits, beans, nuts, seeds, healthy meats such as white chicken and fish and whole grains.  - avoid fried foods, fast food, processed foods, sodas, red meet and other fattening foods.  - get a least 150 minutes of aerobic exercise per week.

## 2015-02-13 NOTE — Addendum Note (Signed)
Addended by: Joyce Gross R on: 02/13/2015 10:48 AM   Modules accepted: Orders

## 2015-04-04 DIAGNOSIS — F4322 Adjustment disorder with anxiety: Secondary | ICD-10-CM | POA: Diagnosis not present

## 2015-04-04 DIAGNOSIS — F411 Generalized anxiety disorder: Secondary | ICD-10-CM | POA: Diagnosis not present

## 2015-04-18 DIAGNOSIS — F411 Generalized anxiety disorder: Secondary | ICD-10-CM | POA: Diagnosis not present

## 2015-04-18 DIAGNOSIS — F329 Major depressive disorder, single episode, unspecified: Secondary | ICD-10-CM | POA: Diagnosis not present

## 2015-05-05 ENCOUNTER — Telehealth: Payer: Self-pay | Admitting: *Deleted

## 2015-05-05 NOTE — Telephone Encounter (Signed)
No pt has not had one this year. Pt does not remember the last time she had one. Pt not really on board w/ having mammograms. Would prefer not to have one.. I was able to set pt up a fup

## 2015-05-05 NOTE — Telephone Encounter (Signed)
Left message for pt to call back.  Need to know if pt had mammogram, when and where

## 2015-05-10 ENCOUNTER — Encounter: Payer: Self-pay | Admitting: Family Medicine

## 2015-05-25 DIAGNOSIS — F411 Generalized anxiety disorder: Secondary | ICD-10-CM | POA: Diagnosis not present

## 2015-05-25 DIAGNOSIS — F329 Major depressive disorder, single episode, unspecified: Secondary | ICD-10-CM | POA: Diagnosis not present

## 2015-06-27 DIAGNOSIS — F411 Generalized anxiety disorder: Secondary | ICD-10-CM | POA: Diagnosis not present

## 2015-06-27 DIAGNOSIS — F329 Major depressive disorder, single episode, unspecified: Secondary | ICD-10-CM | POA: Diagnosis not present

## 2015-07-10 ENCOUNTER — Ambulatory Visit: Admitting: Family Medicine

## 2015-07-31 ENCOUNTER — Encounter: Payer: Self-pay | Admitting: *Deleted

## 2015-08-07 ENCOUNTER — Ambulatory Visit: Admitting: Family Medicine

## 2015-09-06 DIAGNOSIS — F411 Generalized anxiety disorder: Secondary | ICD-10-CM | POA: Diagnosis not present

## 2015-09-06 DIAGNOSIS — F329 Major depressive disorder, single episode, unspecified: Secondary | ICD-10-CM | POA: Diagnosis not present

## 2015-09-21 DIAGNOSIS — F329 Major depressive disorder, single episode, unspecified: Secondary | ICD-10-CM | POA: Diagnosis not present

## 2015-09-21 DIAGNOSIS — F411 Generalized anxiety disorder: Secondary | ICD-10-CM | POA: Diagnosis not present

## 2015-10-26 DIAGNOSIS — F411 Generalized anxiety disorder: Secondary | ICD-10-CM | POA: Diagnosis not present

## 2015-10-26 DIAGNOSIS — F4323 Adjustment disorder with mixed anxiety and depressed mood: Secondary | ICD-10-CM | POA: Diagnosis not present

## 2015-11-06 DIAGNOSIS — R21 Rash and other nonspecific skin eruption: Secondary | ICD-10-CM | POA: Diagnosis not present

## 2015-11-07 DIAGNOSIS — F411 Generalized anxiety disorder: Secondary | ICD-10-CM | POA: Diagnosis not present

## 2015-11-07 DIAGNOSIS — F329 Major depressive disorder, single episode, unspecified: Secondary | ICD-10-CM | POA: Diagnosis not present

## 2015-11-16 ENCOUNTER — Telehealth: Payer: Self-pay | Admitting: Family Medicine

## 2015-11-16 NOTE — Telephone Encounter (Signed)
Received records from Ulis Rias MD forwarded 11 pages to Dr. Colin Benton 11/16/15 [facility].

## 2015-11-27 ENCOUNTER — Ambulatory Visit: Payer: Medicare Other | Admitting: Family Medicine

## 2015-12-07 DIAGNOSIS — D485 Neoplasm of uncertain behavior of skin: Secondary | ICD-10-CM | POA: Diagnosis not present

## 2015-12-07 DIAGNOSIS — L728 Other follicular cysts of the skin and subcutaneous tissue: Secondary | ICD-10-CM | POA: Diagnosis not present

## 2015-12-07 DIAGNOSIS — L810 Postinflammatory hyperpigmentation: Secondary | ICD-10-CM | POA: Diagnosis not present

## 2016-05-23 ENCOUNTER — Ambulatory Visit: Admitting: Family Medicine

## 2016-06-04 ENCOUNTER — Telehealth: Payer: Self-pay | Admitting: *Deleted

## 2016-06-04 NOTE — Telephone Encounter (Signed)
I left a detailed message at the pts home number to call the office to schedule an appt for her physical.

## 2016-07-05 ENCOUNTER — Ambulatory Visit: Payer: Medicare Other | Admitting: Family Medicine

## 2016-07-11 ENCOUNTER — Ambulatory Visit: Payer: Medicare Other | Admitting: Family Medicine

## 2016-08-27 ENCOUNTER — Ambulatory Visit: Admitting: Family Medicine

## 2016-10-01 ENCOUNTER — Ambulatory Visit (INDEPENDENT_AMBULATORY_CARE_PROVIDER_SITE_OTHER): Payer: Medicare Other | Admitting: Family Medicine

## 2016-10-01 ENCOUNTER — Encounter: Payer: Self-pay | Admitting: Family Medicine

## 2016-10-01 VITALS — BP 120/82 | HR 85 | Temp 97.9°F | Ht 67.25 in

## 2016-10-01 DIAGNOSIS — E538 Deficiency of other specified B group vitamins: Secondary | ICD-10-CM

## 2016-10-01 NOTE — Progress Notes (Signed)
Pre visit review using our clinic review tool, if applicable. No additional management support is needed unless otherwise documented below in the visit note. Patient declines weight measurement today. 

## 2016-10-01 NOTE — Progress Notes (Signed)
HPI:   Anita Dickerson is a pleasant 60 with a PMH sig for anxiety and depression (sees psychiatry), hx poor compliance with follow up and recommendations. She has not been seen in over one year. She wonders about side effect with b12 injection. Is anxious about side effect. She agrees to set up physical and labs.  She wants a summary of her psychiatric condition and care to give to [facility] (she reports this store falsely accused her of stealing  remotely). This was managed by her psychiatrists, but she is no longer seeing Dr. Bridgett Larsson as she reports he moved to [place] and closed his practice. She still deals with significant anxiety regarding remote incident at drugstore in which she reports she was accused of stealing. However, she reports she did not feel she needed to continue to see a psychiatrist or take medications for this. She still has strong faith per her report and feels "closer to Jesus."   ROS: See pertinent positives and negatives per HPI.  Past Medical History:  Diagnosis Date  . Depression   . 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 Vitamin B 12 deficiency     Past Surgical History:  Procedure Laterality Date  . COLON SURGERY    . HAND SURGERY    . MYOMECTOMY  [id number]  . SMALL INTESTINE SURGERY     ileum/jej for sbo  . TOE SURGERY      Family History  Problem Relation Age of Onset  . Heart disease Mother   . Diabetes Father   . Kidney failure Father   . Diabetes Sister   . Hypertension Sister   . Heart disease Sister     Social History   Social History  . Marital status: Married    Spouse name: N/A  . Number of children: N/A  . Years of education: N/A   Social History Main Topics  . Smoking status: Never Smoker  . Smokeless tobacco: None  . Alcohol use No  . Drug use: No  . Sexual activity: Not Asked   Other Topics Concern  . 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  ARIPiprazole (ABILIFY) 2 MG tablet, Take 2 mg by mouth at bedtime., Disp: , Rfl:  .  busPIRone (BUSPAR) 15 MG tablet, Take 15 mg by mouth 2 (two) times daily., Disp: , Rfl:  .  LORazepam (ATIVAN) 0.5 MG tablet, Take 0.5 mg by mouth 3 (three) times daily as needed for anxiety., Disp: , Rfl:  .  mirtazapine (REMERON) 30 MG tablet, Take 30 mg by mouth at bedtime., Disp: , Rfl:  .  PARoxetine (PAXIL) 20 MG tablet, Take 20 mg by mouth every morning., Disp: , Rfl:   EXAM:  Vitals:   10/01/16 1431  BP: 120/82  Pulse: 85  Temp: 97.9 F (36.6 C)    There is no height or weight on file to calculate BMI.  GENERAL: vitals reviewed and listed above, alert, oriented, appears well hydrated and in no acute distress  HEENT: atraumatic, conjunttiva clear, no obvious abnormalities on inspection of external nose and ears  NECK: no  obvious masses on inspection  LUNGS: clear to auscultation bilaterally, no wheezes, rales or rhonchi, good air movement  CV: HRRR, no peripheral edema  MS: moves all extremities without noticeable abnormality  PSYCH: pleasant and cooperative, no obvious depression or anxiety  ASSESSMENT AND PLAN:  Discussed the following assessment and plan:  B12 deficiency  -fragmented care, she unfortunately does not follow care recommendations and rarely comes in -she seems to be doing better, but did advise she establish care with a new psychiatrist - advised I did not manage her psychiatric health, so would not be well equipped to provide summary - advised that she can request medical records -advise she is due for regular preventive care, HM due and labs - she agrees to set up physical for this and I advised her not to delat -advised of options for b12 def  - she is anxious about inj so advised trial sublingual b12 with lab check at physical -Patient advised to return or notify a doctor immediately if symptoms worsen or persist or new concerns arise.  Patient Instructions  BEFORE YOU LEAVE: -follow up: Medicare exam in 3 months; please come fastin for labs that day  Vit B12 sublingual 1047mcg daily - this is available over the counter  Please establish care with a new psychiatrist to assist with stress Please contact your insurance company to see which providers are in network.      Colin Benton R., DO

## 2016-10-01 NOTE — Patient Instructions (Addendum)
BEFORE YOU LEAVE: -follow up: Medicare exam in 3 months; please come fastin for labs that day  Vit B12 sublingual 1089mcg daily - this is available over the counter  Please establish care with a new psychiatrist to assist with stress Please contact your insurance company to see which providers are in network.

## 2016-10-21 ENCOUNTER — Encounter: Admitting: Family Medicine

## 2016-10-29 ENCOUNTER — Ambulatory Visit (INDEPENDENT_AMBULATORY_CARE_PROVIDER_SITE_OTHER): Payer: Medicare Other | Admitting: *Deleted

## 2016-10-29 DIAGNOSIS — E538 Deficiency of other specified B group vitamins: Secondary | ICD-10-CM

## 2016-10-29 MED ORDER — CYANOCOBALAMIN 1000 MCG/ML IJ SOLN
1000.0000 ug | Freq: Once | INTRAMUSCULAR | Status: AC
  Administered 2016-10-29: 1000 ug via INTRAMUSCULAR

## 2016-10-29 NOTE — Progress Notes (Signed)
Patient advised to follow up in 1 month for next B12 injection per Dr Maudie Mercury.

## 2016-11-26 ENCOUNTER — Ambulatory Visit (INDEPENDENT_AMBULATORY_CARE_PROVIDER_SITE_OTHER): Payer: Medicare Other

## 2016-11-26 DIAGNOSIS — E538 Deficiency of other specified B group vitamins: Secondary | ICD-10-CM

## 2016-11-26 MED ORDER — CYANOCOBALAMIN 1000 MCG/ML IJ SOLN
1000.0000 ug | Freq: Once | INTRAMUSCULAR | Status: AC
  Administered 2016-11-26: 1000 ug via INTRAMUSCULAR

## 2017-01-08 ENCOUNTER — Encounter: Payer: Self-pay | Admitting: Family Medicine

## 2017-01-08 DIAGNOSIS — E538 Deficiency of other specified B group vitamins: Secondary | ICD-10-CM | POA: Insufficient documentation

## 2017-01-08 NOTE — Progress Notes (Deleted)
HPI:  Here for CPE:  -Concerns and/or follow up today:  Hx anxiety, depression (managed by psychiatrist in the past), B12 deficiency, small bowel obstruction, tachycardia, hyperglycemia and poor compliance. Advised to try subling b12 last visit as she was concerned about side effects with the shot. She rarely comes in, and when she does she primarily talks about an incident in the passed when she was per her report wrongly accused of steeling. I have advised her to continue care with psychiatry for this and to have yearly exams and follow up for her medical health issues here, but she has not followed my advice.  -Diet: variety of foods, balance and well rounded, larger portion sizes  -Exercise: no regular exercise  -Taking folic acid, vitamin D or calcium: no  -Diabetes and Dyslipidemia Screening:  -Hx of HTN: no  -Vaccines: UTD  -pap history:  -FDLMP:  -sexual activity: yes, female partner, no new partners  -wants STI testing (Hep C if born 70-65): no  -FH breast, colon or ovarian ca: see FH Last mammogram: Last colon cancer screening:  Breast Ca Risk Assessment: -SolutionApps.it  Genetic Counseling Screen: Http://www.breastcancergenesscreen.org/startScreen.aspx  FRAX (50-65):  DEXA (>/= 65):   -Alcohol, Tobacco, drug use: see social history  Review of Systems - no fevers, unintentional weight loss, vision loss, hearing loss, chest pain, sob, hemoptysis, melena, hematochezia, hematuria, genital discharge, changing or concerning skin lesions, bleeding, bruising, loc, thoughts of self harm or SI  Past Medical History:  Diagnosis Date  . Depression   . 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 Vitamin B 12 deficiency     Past Surgical History:  Procedure Laterality Date  . COLON SURGERY    . HAND SURGERY    . MYOMECTOMY  [id number]  . SMALL INTESTINE SURGERY     ileum/jej for sbo   . TOE SURGERY      Family History  Problem Relation Age of Onset  . Heart disease Mother   . Diabetes Father   . Kidney failure Father   . Diabetes Sister   . Hypertension Sister   . Heart disease Sister     Social History   Social History  . Marital status: Married    Spouse name: N/A  . Number of children: N/A  . Years of education: N/A   Social History Main Topics  . Smoking status: Never Smoker  . Smokeless tobacco: Not on file  . Alcohol use No  . Drug use: No  . Sexual activity: Not on file   Other Topics Concern  . 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  ARIPiprazole (ABILIFY) 2 MG tablet, Take 2 mg by mouth at bedtime., Disp: , Rfl:  .  busPIRone (BUSPAR) 15 MG tablet, Take 15 mg by mouth 2 (two) times daily., Disp: , Rfl:  .  LORazepam (ATIVAN) 0.5 MG tablet, Take 0.5 mg by mouth 3 (three) times daily as needed for anxiety., Disp: , Rfl:  .  mirtazapine (REMERON) 30 MG tablet, Take 30 mg by mouth at bedtime., Disp: , Rfl:  .  PARoxetine (PAXIL) 20  MG tablet, Take 20 mg by mouth every morning., Disp: , Rfl:   EXAM:  There were no vitals filed for this visit.  GENERAL: vitals reviewed and listed below, alert, oriented, appears well hydrated and in no acute distress  HEENT: head atraumatic, PERRLA, normal appearance of eyes, ears, nose and mouth. moist mucus membranes.  NECK: supple, no masses or lymphadenopathy  LUNGS: clear to auscultation bilaterally, no rales, rhonchi or wheeze  CV: HRRR, no peripheral edema or cyanosis, normal pedal pulses  ABDOMEN: bowel sounds normal, soft, non tender to palpation, no masses, no rebound or guarding  SKIN: no rash or abnormal lesions  MS: normal gait, moves all  extremities normally  NEURO: normal gait, speech and thought processing grossly intact, muscle tone grossly intact throughout  PSYCH: normal affect, pleasant and cooperative  ASSESSMENT AND PLAN:  Discussed the following assessment and plan:  There are no diagnoses linked to this encounter.  -Discussed and advised all [place] preventive services health task force level A and B recommendations for age, sex and risks.  -Advised at least 150 minutes of exercise per week and a healthy diet with avoidance of (less then 1 serving per week) processed foods, white starches, red meat, fast foods and sweets and consisting of: * 5-9 servings of fresh fruits and vegetables (not corn or potatoes) *nuts and seeds, beans *olives and olive oil *lean meats such as fish and white chicken  *whole grains  -labs, studies and vaccines per orders this encounter  No orders of the defined types were placed in this encounter.   Patient advised to return to clinic immediately if symptoms worsen or persist or new concerns.  There are no Patient Instructions on file for this visit.  No Follow-up on file.  Colin Benton R., DO

## 2017-01-09 ENCOUNTER — Encounter: Admitting: Family Medicine

## 2017-02-03 ENCOUNTER — Telehealth: Payer: Self-pay | Admitting: Family Medicine

## 2017-04-29 NOTE — Telephone Encounter (Signed)
Called to see if pt wanted to schedule awv - left message.  

## 2017-06-25 DIAGNOSIS — N644 Mastodynia: Secondary | ICD-10-CM | POA: Diagnosis not present

## 2017-06-25 DIAGNOSIS — R002 Palpitations: Secondary | ICD-10-CM | POA: Diagnosis not present

## 2017-06-25 DIAGNOSIS — N6459 Other signs and symptoms in breast: Secondary | ICD-10-CM | POA: Diagnosis not present

## 2017-06-27 DIAGNOSIS — Z1231 Encounter for screening mammogram for malignant neoplasm of breast: Secondary | ICD-10-CM | POA: Diagnosis not present

## 2017-07-01 ENCOUNTER — Encounter: Payer: Self-pay | Admitting: *Deleted

## 2017-07-01 ENCOUNTER — Encounter: Payer: Self-pay | Admitting: Family Medicine

## 2017-07-01 ENCOUNTER — Ambulatory Visit (INDEPENDENT_AMBULATORY_CARE_PROVIDER_SITE_OTHER): Payer: Medicare Other | Admitting: Family Medicine

## 2017-07-01 VITALS — BP 132/80 | HR 93 | Temp 98.4°F | Ht 67.25 in

## 2017-07-01 DIAGNOSIS — S0993XD Unspecified injury of face, subsequent encounter: Secondary | ICD-10-CM | POA: Diagnosis not present

## 2017-07-01 DIAGNOSIS — R030 Elevated blood-pressure reading, without diagnosis of hypertension: Secondary | ICD-10-CM | POA: Diagnosis not present

## 2017-07-01 NOTE — Patient Instructions (Addendum)
BEFORE YOU LEAVE: -follow LM:[id number] in 1-3 months

## 2017-07-01 NOTE — Progress Notes (Signed)
HPI:  Acute visit for letter regarding mouth injury in 2015. She was treated at [facility] (several times per her report) for mouth injury from a bone in greens at [employer]. She saw [place] for follow up and lesion was resolved at that time. She reports [employer] needs a letter by this Thursday stating date seen and reason without other information regarding other issues from the visit. She agrees to set up physical for follow up other issues - has been busy caring for sister and brother in law whom ar in poor health and reports has not been able to follow up for that reason. BP a little elevated on arrival - she reports is nervous about closing out the [employer] case. Reports can come for physical in a few months.  ROS: See pertinent positives and negatives per HPI.  Past Medical History:  Diagnosis Date  . Anxiety   . Depression   . 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 Hyperglycemia   . 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 Vitamin B 12 deficiency     Past Surgical History:  Procedure Laterality Date  . COLON SURGERY    . HAND SURGERY    . MYOMECTOMY  [id number]  . SMALL INTESTINE SURGERY     ileum/jej for sbo  . TOE SURGERY      Family History  Problem Relation Age of Onset  . Heart disease Mother   . Diabetes Father   . Kidney failure Father   . Diabetes Sister   . Hypertension Sister   . Heart disease Sister     Social History   Social History  . Marital status: Married    Spouse name: N/A  . Number of children: N/A  . Years of education: N/A   Social History Main Topics  . Smoking status: Never Smoker  . Smokeless tobacco: Never Used  . Alcohol use No  . Drug use: No  . Sexual activity: Not Asked   Other Topics Concern  . 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  ARIPiprazole (ABILIFY) 2 MG tablet, Take 2 mg by mouth at bedtime., Disp: , Rfl:  .  busPIRone (BUSPAR) 15 MG tablet, Take 15 mg by mouth 2 (two) times daily., Disp: , Rfl:  .  LORazepam (ATIVAN) 0.5 MG tablet, Take 0.5 mg by mouth 3 (three) times daily as needed for anxiety., Disp: , Rfl:  .  mirtazapine (REMERON) 30 MG tablet, Take 30 mg by mouth at bedtime., Disp: , Rfl:  .  PARoxetine (PAXIL) 20 MG tablet, Take 20 mg by mouth every morning., Disp: , Rfl:   EXAM:  Vitals:   07/01/17 1641  BP: 132/80  Pulse: 93  Temp: 98.4 F (36.9 C)    There is no height or weight on file to calculate BMI.  GENERAL: vitals reviewed and listed above, alert, oriented, appears well hydrated and in no acute distress  HEENT: atraumatic, conjunttiva clear, no obvious abnormalities on inspection of external nose and ears  NECK: no obvious masses on inspection  LUNGS: clear to auscultation bilaterally, no wheezes, rales or rhonchi, good air movement  CV: HRRR, no peripheral edema  MS: moves all extremities without noticeable abnormality  PSYCH: pleasant and cooperative, no obvious depression or anxiety  ASSESSMENT AND PLAN:  Discussed the following assessment and plan:  Injury of mouth, subsequent encounter  Elevated BP without diagnosis of hypertension  -letter per her request provided -BP better on recheck - - will plan to recheck at physical -advised physical in 1-3 months -Patient advised to return or notify a doctor immediately if symptoms worsen or persist or new concerns arise.  Patient Instructions  BEFORE YOU LEAVE: -follow [id number] in 1-3 months    Colin Benton R., DO

## 2017-07-01 NOTE — Progress Notes (Signed)
Patient declined weight measurement today. 

## 2017-07-03 ENCOUNTER — Institutional Professional Consult (permissible substitution): Payer: Medicare Other | Admitting: Family Medicine

## 2017-07-08 ENCOUNTER — Other Ambulatory Visit: Payer: Self-pay

## 2017-07-09 DIAGNOSIS — Z887 Allergy status to serum and vaccine status: Secondary | ICD-10-CM | POA: Diagnosis not present

## 2017-07-09 DIAGNOSIS — S20111A Abrasion of breast, right breast, initial encounter: Secondary | ICD-10-CM | POA: Diagnosis not present

## 2017-07-09 DIAGNOSIS — N644 Mastodynia: Secondary | ICD-10-CM | POA: Diagnosis not present

## 2017-07-29 DIAGNOSIS — I10 Essential (primary) hypertension: Secondary | ICD-10-CM | POA: Diagnosis not present

## 2017-07-29 DIAGNOSIS — R002 Palpitations: Secondary | ICD-10-CM | POA: Diagnosis not present

## 2017-08-27 DIAGNOSIS — I10 Essential (primary) hypertension: Secondary | ICD-10-CM | POA: Diagnosis not present

## 2017-08-27 DIAGNOSIS — N644 Mastodynia: Secondary | ICD-10-CM | POA: Diagnosis not present

## 2017-08-27 DIAGNOSIS — I517 Cardiomegaly: Secondary | ICD-10-CM | POA: Diagnosis not present

## 2017-08-28 DIAGNOSIS — N6459 Other signs and symptoms in breast: Secondary | ICD-10-CM | POA: Diagnosis not present

## 2017-08-30 ENCOUNTER — Encounter (HOSPITAL_COMMUNITY): Payer: Self-pay | Admitting: *Deleted

## 2017-08-30 ENCOUNTER — Emergency Department (HOSPITAL_COMMUNITY): Payer: Medicare Other

## 2017-08-30 ENCOUNTER — Emergency Department (HOSPITAL_COMMUNITY)
Admission: EM | Admit: 2017-08-30 | Discharge: 2017-08-30 | Disposition: A | Discharge status: Home / Self Care | Payer: Medicare Other | Attending: Emergency Medicine | Admitting: Emergency Medicine

## 2017-08-30 DIAGNOSIS — Z79899 Other long term (current) drug therapy: Secondary | ICD-10-CM | POA: Diagnosis not present

## 2017-08-30 DIAGNOSIS — R0789 Other chest pain: Secondary | ICD-10-CM | POA: Diagnosis not present

## 2017-08-30 DIAGNOSIS — R030 Elevated blood-pressure reading, without diagnosis of hypertension: Secondary | ICD-10-CM | POA: Diagnosis not present

## 2017-08-30 DIAGNOSIS — F329 Major depressive disorder, single episode, unspecified: Secondary | ICD-10-CM | POA: Insufficient documentation

## 2017-08-30 DIAGNOSIS — G4489 Other headache syndrome: Secondary | ICD-10-CM | POA: Diagnosis not present

## 2017-08-30 DIAGNOSIS — R519 Headache, unspecified: Secondary | ICD-10-CM

## 2017-08-30 DIAGNOSIS — R51 Headache: Secondary | ICD-10-CM | POA: Insufficient documentation

## 2017-08-30 DIAGNOSIS — F419 Anxiety disorder, unspecified: Secondary | ICD-10-CM | POA: Insufficient documentation

## 2017-08-30 DIAGNOSIS — R42 Dizziness and giddiness: Secondary | ICD-10-CM | POA: Diagnosis not present

## 2017-08-30 LAB — BASIC METABOLIC PANEL
Anion gap: 12 (ref 5–15)
BUN: 5 mg/dL — AB (ref 6–20)
CHLORIDE: 104 mmol/L (ref 101–111)
CO2: 22 mmol/L (ref 22–32)
CREATININE: 0.81 mg/dL (ref 0.44–1.00)
Calcium: 9 mg/dL (ref 8.9–10.3)
GFR calc Af Amer: >60 mL/min (ref >60)
GFR calc non Af Amer: >60 mL/min (ref >60)
GLUCOSE: 91 mg/dL (ref 65–99)
POTASSIUM: 3.6 mmol/L (ref 3.5–5.1)
Sodium: 138 mmol/L (ref 135–145)

## 2017-08-30 LAB — CBC WITH DIFFERENTIAL/PLATELET
Basophils Absolute: 0 10*3/uL (ref 0.0–0.1)
Basophils Relative: 0 %
EOS ABS: 0 10*3/uL (ref 0.0–0.7)
Eosinophils Relative: 1 %
HEMATOCRIT: 33.4 % — AB (ref 36.0–46.0)
HEMOGLOBIN: 11.1 g/dL — AB (ref 12.0–15.0)
LYMPHS ABS: 1.7 10*3/uL (ref 0.7–4.0)
LYMPHS PCT: 40 %
MCH: 29.7 pg (ref 26.0–34.0)
MCHC: 33.2 g/dL (ref 30.0–36.0)
MCV: 89.3 fL (ref 78.0–100.0)
Monocytes Absolute: 0.4 10*3/uL (ref 0.1–1.0)
Monocytes Relative: 9 %
NEUTROS PCT: 50 %
Neutro Abs: 2.2 10*3/uL (ref 1.7–7.7)
Platelets: 205 10*3/uL (ref 150–400)
RBC: 3.74 MIL/uL — AB (ref 3.87–5.11)
RDW: 14.6 % (ref 11.5–15.5)
WBC: 4.3 10*3/uL (ref 4.0–10.5)

## 2017-08-30 LAB — I-STAT TROPONIN, ED: TROPONIN I, POC: 0 ng/mL (ref 0.00–0.08)

## 2017-08-30 MED ORDER — METOCLOPRAMIDE HCL 5 MG/ML IJ SOLN
10.0000 mg | Freq: Once | INTRAMUSCULAR | Status: AC
  Administered 2017-08-30: 10 mg via INTRAVENOUS
  Filled 2017-08-30: qty 2

## 2017-08-30 MED ORDER — DEXAMETHASONE SODIUM PHOSPHATE 10 MG/ML IJ SOLN
10.0000 mg | Freq: Once | INTRAMUSCULAR | Status: AC
  Administered 2017-08-30: 10 mg via INTRAVENOUS
  Filled 2017-08-30: qty 1

## 2017-08-30 MED ORDER — PANTOPRAZOLE SODIUM 20 MG PO TBEC: 20.0000 mg | DELAYED_RELEASE_TABLET | Freq: Every day | ORAL | 0 refills | Status: DC

## 2017-08-30 MED ORDER — GI COCKTAIL ~~LOC~~
30.0000 mL | Freq: Once | ORAL | Status: AC
  Administered 2017-08-30: 30 mL via ORAL
  Filled 2017-08-30: qty 30

## 2017-08-30 NOTE — ED Notes (Signed)
Pt ambulated to restroom, no issues. Pt placed back on monitor. 

## 2017-08-30 NOTE — ED Provider Notes (Signed)
[facility] DEPT Provider Note   CSN: [id number] Arrival date & time: 08/30/17  1050     History   Chief Complaint Chief Complaint  Patient presents with  . Headache  . Hypertension    HPI Anita Dickerson is a 71 y.o. female.  The history is provided by the patient and medical records. No language interpreter was used.  Headache   Associated symptoms include shortness of breath (Intermittent x 6 years with no acute change).  Hypertension  Associated symptoms include chest pain (Intermittent x 6 years with no acute change), headaches and shortness of breath (Intermittent x 6 years with no acute change).   Anita Dickerson is a 71 y.o. female  with a PMH of anxiety, depression, b12 deficiency who presents to the Emergency Department complaining of posterior headache x 2-3 weeks. She states that she also will intermittently feel like her "eyes feel like they want to close". No visual changes or abdominal pain. Patient states that her blood pressure has been running high this week as well. She states that she was seen in an outpatient clinic a few weeks ago and told her BP was high at that time as well. She denies any history of HTN and is not on any blood pressure medications. She also states that several years ago someone told her that she had a pituitary tumor. She does not remember the name or specialty of this physician and has not followed up with anyone about this. There is no mention in chart review of this either. She secondly complains of intermittent central chest pain that will last a few seconds then resolve - associated with shortness of breath -  which has been going on since her previous abdominal surgery around 6 years ago. No worsening or change in pain recently.   Past Medical History:  Diagnosis Date  . Anxiety   . Depression   . 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 Hyperglycemia   . 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 Vitamin B 12  deficiency     Patient Active Problem List   Diagnosis Date Noted  . B12 deficiency 01/08/2017  . Depression and anxiety - sees psych 02/13/2015    Past Surgical History:  Procedure Laterality Date  . COLON SURGERY    . HAND SURGERY    . MYOMECTOMY  [id number]  . SMALL INTESTINE SURGERY     ileum/jej for sbo  . TOE SURGERY      OB History    No data available       Home Medications    Prior to Admission medications   Medication Sig Start Date End Date Taking? Authorizing Provider  ARIPiprazole (ABILIFY) 2 MG tablet Take 2 mg by mouth at bedtime.    [provider]  busPIRone (BUSPAR) 15 MG tablet Take 15 mg by mouth 2 (two) times daily.    [provider]  LORazepam (ATIVAN) 0.5 MG tablet Take 0.5 mg by mouth 3 (three) times daily as needed for anxiety.    [provider]  mirtazapine (REMERON) 30 MG tablet Take 30 mg by mouth at bedtime.    [provider]  pantoprazole (PROTONIX) 20 MG tablet Take 1 tablet (20 mg total) by mouth daily. 08/30/17   Ward, Ozella Almond, PA-C  PARoxetine (PAXIL) 20 MG tablet Take 20 mg by mouth every morning.    [provider]    Family History Family History  Problem Relation Age of  Onset  . Heart disease Mother   . Diabetes Father   . Kidney failure Father   . Diabetes Sister   . Hypertension Sister   . Heart disease Sister     Social History Social History  Substance Use Topics  . Smoking status: Never Smoker  . Smokeless tobacco: Never Used  . Alcohol use No     Allergies   Tetanus toxoids   Review of Systems Review of Systems  Respiratory: Positive for shortness of breath (Intermittent x 6 years with no acute change).   Cardiovascular: Positive for chest pain (Intermittent x 6 years with no acute change).  Neurological: Positive for headaches. Negative for dizziness, speech difficulty, weakness and numbness.  All other systems reviewed and are negative.    Physical  Exam Updated Vital Signs BP (!) 144/101   Pulse 70   Temp 98.3 F (36.8 C) (Oral)   Resp 20   Ht 5' 7.25" (1.708 m)   Wt 56.7 kg (125 lb)   SpO2 100%   BMI 19.43 kg/m   Physical Exam  Constitutional: She is oriented to person, place, and time. She appears well-developed and well-nourished. No distress.  HENT:  Head: Normocephalic and atraumatic.  Cardiovascular: Normal rate, regular rhythm and normal heart sounds.   No murmur heard. Pulmonary/Chest: Effort normal and breath sounds normal. No respiratory distress.  Abdominal: Soft. She exhibits no distension. There is no tenderness.  Musculoskeletal: She exhibits no edema.  Neurological: She is alert and oriented to person, place, and time.  Alert, oriented, thought content appropriate, able to give a coherent history. Speech is clear and goal oriented, able to follow commands.  Cranial Nerves:  II:  Peripheral visual fields grossly normal, pupils equal, round, reactive to light III, IV, VI: EOM intact bilaterally, ptosis not present V,VII: smile symmetric, eyes kept closed tightly against resistance, facial light touch sensation equal VIII: hearing grossly normal IX, X: symmetric soft palate movement, uvula elevates symmetrically  XI: bilateral shoulder shrug symmetric and strong XII: midline tongue extension 5/5 muscle strength in upper and lower extremities bilaterally including strong and equal grip strength and dorsiflexion/plantar flexion Sensory to light touch normal in all four extremities.  Normal finger-to-nose and rapid alternating movements; no drift.  Skin: Skin is warm and dry.  Nursing note and vitals reviewed.    ED Treatments / Results  Labs (all labs ordered are listed, but only abnormal results are displayed) Labs Reviewed  CBC WITH DIFFERENTIAL/PLATELET - Abnormal; Notable for the following:       Result Value   RBC 3.74 (*)    Hemoglobin 11.1 (*)    HCT 33.4 (*)    All other components within  normal limits  BASIC METABOLIC PANEL - Abnormal; Notable for the following:    BUN 5 (*)    All other components within normal limits  I-STAT TROPONIN, ED    EKG  EKG Interpretation  Date/Time:  Saturday August 30 2017 10:57:36 EDT Ventricular Rate:  83 PR Interval:    QRS Duration: 91 QT Interval:  365 QTC Calculation: 429 R Axis:   66 Text Interpretation:  Sinus rhythm Probable left atrial enlargement Probable left ventricular hypertrophy No significant change since last tracing Confirmed by Blanchie Dessert [phone]) on 08/30/2017 11:22:36 AM       Radiology Ct Head Wo Contrast  Result Date: 08/30/2017 CLINICAL DATA:  Occipital headache and dizziness. EXAM: CT HEAD WITHOUT CONTRAST TECHNIQUE: Contiguous axial images were obtained from the base of  the skull through the vertex without intravenous contrast. COMPARISON:  None. FINDINGS: Brain: No evidence of acute infarction, hemorrhage, hydrocephalus, extra-axial collection or mass lesion/mass effect. Vascular: No hyperdense vessel or unexpected calcification. Atherosclerotic vascular calcification of the carotid siphons. Skull: Normal. Negative for fracture or focal lesion. Sinuses/Orbits: Minimal mucosal thickening in the left maxillary sinus. Remaining paranasal sinuses and mastoid air cells are clear. The orbits are unremarkable. Other: None. IMPRESSION: Normal for age noncontrast head CT. Electronically Signed   By: Titus Dubin M.D.   On: 08/30/2017 12:13    Procedures Procedures (including critical care time)  Medications Ordered in ED Medications  dexamethasone (DECADRON) injection 10 mg (10 mg Intravenous Given 08/30/17 1340)  metoCLOPramide (REGLAN) injection 10 mg (10 mg Intravenous Given 08/30/17 1342)  gi cocktail (Maalox,Lidocaine,Donnatal) (30 mLs Oral Given 08/30/17 1440)     Initial Impression / Assessment and Plan / ED Course  I have reviewed the triage vital signs and the nursing notes.  Pertinent labs &  imaging results that were available during my care of the patient were reviewed by me and considered in my medical decision making (see chart for details).    Anita Dickerson is a 71 y.o. female who presents to ED for headache x 2-3 weeks. Patient took BP at home and was very concerned that machine read high. No history of HTN or taking any BP medications. BP initially 185/105. Recheck 149/98.  No focal neuro deficits on exam. No red flag symptoms of headache. CT head negative. Labs without signs of end-organ damage. Troponin negative. EKG reassuring. Doubt cardiopulmonary etiology. GI cocktail given with improvement. Will have patient follow up with GI. Evaluation does not show pathology that would require ongoing emergent intervention or inpatient treatment. Reasons to return to ER discussed. All questions answered.   Patient seen by and discussed with Dr. Maryan Rued who agrees with treatment plan.    Final Clinical Impressions(s) / ED Diagnoses   Final diagnoses:  Bad headache  Atypical chest pain    New Prescriptions New Prescriptions   PANTOPRAZOLE (PROTONIX) 20 MG TABLET    Take 1 tablet (20 mg total) by mouth daily.     Ward, Ozella Almond, PA-C 08/30/17 1518    Blanchie Dessert, MD 08/31/17 2221

## 2017-08-30 NOTE — ED Triage Notes (Signed)
Pt here from home via [facility] or occipital headache, dizziness  and bp of205/115.  Pt also c/o "funny feeling" in her chest and sob.  Pt has had symptoms for several weeks, but she called ems b/c her bp machine read high.

## 2017-08-30 NOTE — Discharge Instructions (Signed)
It was my pleasure taking care of you today!   Take Protonix as directed. Please follow up with the GI clinic listed for further discussion of your symptoms.   Return to ER for new or worsening symptoms, any additional concerns.

## 2017-09-04 DIAGNOSIS — L720 Epidermal cyst: Secondary | ICD-10-CM | POA: Diagnosis not present

## 2017-09-04 DIAGNOSIS — C720 Malignant neoplasm of spinal cord: Secondary | ICD-10-CM | POA: Diagnosis not present

## 2017-09-04 DIAGNOSIS — Z9049 Acquired absence of other specified parts of digestive tract: Secondary | ICD-10-CM | POA: Diagnosis not present

## 2017-09-04 DIAGNOSIS — Z853 Personal history of malignant neoplasm of breast: Secondary | ICD-10-CM | POA: Diagnosis not present

## 2017-09-04 DIAGNOSIS — C50011 Malignant neoplasm of nipple and areola, right female breast: Secondary | ICD-10-CM | POA: Diagnosis not present

## 2017-09-04 DIAGNOSIS — Z170 Estrogen receptor positive status [ER+]: Secondary | ICD-10-CM | POA: Diagnosis not present

## 2017-09-04 DIAGNOSIS — N6459 Other signs and symptoms in breast: Secondary | ICD-10-CM | POA: Diagnosis not present

## 2017-09-04 DIAGNOSIS — C44591 Other specified malignant neoplasm of skin of breast: Secondary | ICD-10-CM | POA: Diagnosis not present

## 2017-09-04 DIAGNOSIS — N6489 Other specified disorders of breast: Secondary | ICD-10-CM | POA: Diagnosis not present

## 2017-09-04 DIAGNOSIS — D259 Leiomyoma of uterus, unspecified: Secondary | ICD-10-CM | POA: Diagnosis not present

## 2017-09-15 ENCOUNTER — Ambulatory Visit: Payer: Medicare Other

## 2017-09-16 DIAGNOSIS — Z9889 Other specified postprocedural states: Secondary | ICD-10-CM | POA: Diagnosis not present

## 2017-09-16 DIAGNOSIS — Z170 Estrogen receptor positive status [ER+]: Secondary | ICD-10-CM | POA: Diagnosis not present

## 2017-09-16 DIAGNOSIS — C50011 Malignant neoplasm of nipple and areola, right female breast: Secondary | ICD-10-CM | POA: Diagnosis not present

## 2017-09-19 DIAGNOSIS — D649 Anemia, unspecified: Secondary | ICD-10-CM | POA: Diagnosis not present

## 2017-09-19 DIAGNOSIS — Z1211 Encounter for screening for malignant neoplasm of colon: Secondary | ICD-10-CM | POA: Diagnosis not present

## 2017-09-19 DIAGNOSIS — Z9049 Acquired absence of other specified parts of digestive tract: Secondary | ICD-10-CM | POA: Diagnosis not present

## 2017-09-19 DIAGNOSIS — R799 Abnormal finding of blood chemistry, unspecified: Secondary | ICD-10-CM | POA: Diagnosis not present

## 2017-09-25 DIAGNOSIS — I10 Essential (primary) hypertension: Secondary | ICD-10-CM | POA: Diagnosis not present

## 2017-09-25 DIAGNOSIS — N6001 Solitary cyst of right breast: Secondary | ICD-10-CM | POA: Diagnosis not present

## 2017-09-25 DIAGNOSIS — K219 Gastro-esophageal reflux disease without esophagitis: Secondary | ICD-10-CM | POA: Diagnosis not present

## 2017-09-25 DIAGNOSIS — L720 Epidermal cyst: Secondary | ICD-10-CM | POA: Diagnosis not present

## 2017-09-25 DIAGNOSIS — D0511 Intraductal carcinoma in situ of right breast: Secondary | ICD-10-CM | POA: Diagnosis not present

## 2017-09-25 DIAGNOSIS — C50011 Malignant neoplasm of nipple and areola, right female breast: Secondary | ICD-10-CM | POA: Diagnosis not present

## 2017-09-25 DIAGNOSIS — M1990 Unspecified osteoarthritis, unspecified site: Secondary | ICD-10-CM | POA: Diagnosis not present

## 2017-09-29 DIAGNOSIS — L729 Follicular cyst of the skin and subcutaneous tissue, unspecified: Secondary | ICD-10-CM | POA: Diagnosis not present

## 2017-09-29 DIAGNOSIS — D1801 Hemangioma of skin and subcutaneous tissue: Secondary | ICD-10-CM | POA: Diagnosis not present

## 2017-09-29 DIAGNOSIS — D229 Melanocytic nevi, unspecified: Secondary | ICD-10-CM | POA: Diagnosis not present

## 2017-09-30 NOTE — Progress Notes (Deleted)
  HPI:  Here for follow up and AWV. PMH significant for poor compliance and follow through with recommendations, Anxiety, anemia (chronic, mild), Depression, B12 deficiency, Hyperglycemia and elevated BP.  Seeing Anita Dickerson for AWV.  ROS: See pertinent positives and negatives per HPI.  Past Medical History:  Diagnosis Date  . Anxiety   . Depression   . 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 Hyperglycemia   . 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 Vitamin B 12 deficiency     Past Surgical History:  Procedure Laterality Date  . COLON SURGERY    . HAND SURGERY    . MYOMECTOMY  [id number]  . SMALL INTESTINE SURGERY     ileum/jej for sbo  . TOE SURGERY      Family History  Problem Relation Age of Onset  . Heart disease Mother   . Diabetes Father   . Kidney failure Father   . Diabetes Sister   . Hypertension Sister   . Heart disease Sister     Social History   Social History  . Marital status: Married    Spouse name: N/A  . Number of children: N/A  . Years of education: N/A   Social History Main Topics  . Smoking status: Never Smoker  . Smokeless tobacco: Never Used  . Alcohol use No  . Drug use: No  . Sexual activity: Not on file   Other Topics Concern  . 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  ARIPiprazole (ABILIFY) 2 MG tablet, Take 2 mg by mouth at bedtime., Disp: , Rfl:  .  busPIRone (BUSPAR) 15 MG tablet, Take 15 mg by mouth 2 (two) times daily., Disp: , Rfl:  .  LORazepam (ATIVAN) 0.5 MG tablet, Take 0.5 mg by mouth 3 (three) times daily as needed for anxiety., Disp: , Rfl:  .  mirtazapine (REMERON) 30 MG tablet, Take 30 mg by mouth at bedtime., Disp: , Rfl:  .  pantoprazole (PROTONIX) 20 MG  tablet, Take 1 tablet (20 mg total) by mouth daily., Disp: 30 tablet, Rfl: 0 .  PARoxetine (PAXIL) 20 MG tablet, Take 20 mg by mouth every morning., Disp: , Rfl:   EXAM:  There were no vitals filed for this visit.  There is no height or weight on file to calculate BMI.  GENERAL: vitals reviewed and listed above, alert, oriented, appears well hydrated and in no acute distress  HEENT: atraumatic, conjunttiva clear, no obvious abnormalities on inspection of external nose and ears  NECK: no obvious masses on inspection  LUNGS: clear to auscultation bilaterally, no wheezes, rales or rhonchi, good air movement  CV: HRRR, no peripheral edema  MS: moves all extremities without noticeable abnormality  PSYCH: pleasant and cooperative, no obvious depression or anxiety  ASSESSMENT AND PLAN:  Discussed the following assessment and plan:  No diagnosis found.  -Patient advised to return or notify a doctor immediately if symptoms worsen or persist or new concerns arise.  There are no Patient Instructions on file for this visit.  Colin Benton R., DO

## 2017-10-01 DIAGNOSIS — M25511 Pain in right shoulder: Secondary | ICD-10-CM | POA: Diagnosis not present

## 2017-10-01 DIAGNOSIS — R1013 Epigastric pain: Secondary | ICD-10-CM | POA: Diagnosis not present

## 2017-10-01 DIAGNOSIS — Z13820 Encounter for screening for osteoporosis: Secondary | ICD-10-CM | POA: Diagnosis not present

## 2017-10-01 DIAGNOSIS — Z1211 Encounter for screening for malignant neoplasm of colon: Secondary | ICD-10-CM | POA: Diagnosis not present

## 2017-10-01 DIAGNOSIS — M889 Osteitis deformans of unspecified bone: Secondary | ICD-10-CM | POA: Diagnosis not present

## 2017-10-01 DIAGNOSIS — M79675 Pain in left toe(s): Secondary | ICD-10-CM | POA: Diagnosis not present

## 2017-10-01 DIAGNOSIS — Z8639 Personal history of other endocrine, nutritional and metabolic disease: Secondary | ICD-10-CM | POA: Diagnosis not present

## 2017-10-02 ENCOUNTER — Encounter: Payer: Medicare Other | Admitting: Family Medicine

## 2017-10-02 ENCOUNTER — Encounter: Payer: Self-pay | Admitting: Family Medicine

## 2017-10-02 DIAGNOSIS — Z0289 Encounter for other administrative examinations: Secondary | ICD-10-CM

## 2017-10-02 NOTE — Progress Notes (Signed)
NO SHOW for CPE and AWV. PMH significant for poor compliance and follow through with recommendations, Anxiety, anemia (chronic, mild), Depression, B12 deficiency, Hyperglycemia and elevated BP.

## 2017-10-08 DIAGNOSIS — Z170 Estrogen receptor positive status [ER+]: Secondary | ICD-10-CM | POA: Diagnosis not present

## 2017-10-08 DIAGNOSIS — C50011 Malignant neoplasm of nipple and areola, right female breast: Secondary | ICD-10-CM | POA: Diagnosis not present

## 2017-10-21 DIAGNOSIS — D126 Benign neoplasm of colon, unspecified: Secondary | ICD-10-CM | POA: Diagnosis not present

## 2017-10-21 DIAGNOSIS — C50011 Malignant neoplasm of nipple and areola, right female breast: Secondary | ICD-10-CM | POA: Diagnosis not present

## 2017-10-21 DIAGNOSIS — R1013 Epigastric pain: Secondary | ICD-10-CM | POA: Diagnosis not present

## 2017-10-21 DIAGNOSIS — Z170 Estrogen receptor positive status [ER+]: Secondary | ICD-10-CM | POA: Diagnosis not present

## 2017-10-21 DIAGNOSIS — Z980 Intestinal bypass and anastomosis status: Secondary | ICD-10-CM | POA: Diagnosis not present

## 2017-10-21 DIAGNOSIS — Z1211 Encounter for screening for malignant neoplasm of colon: Secondary | ICD-10-CM | POA: Diagnosis not present

## 2017-10-21 DIAGNOSIS — K3189 Other diseases of stomach and duodenum: Secondary | ICD-10-CM | POA: Diagnosis not present

## 2017-10-21 DIAGNOSIS — D122 Benign neoplasm of ascending colon: Secondary | ICD-10-CM | POA: Diagnosis not present

## 2017-10-22 DIAGNOSIS — C50011 Malignant neoplasm of nipple and areola, right female breast: Secondary | ICD-10-CM | POA: Diagnosis not present

## 2017-10-22 DIAGNOSIS — Z170 Estrogen receptor positive status [ER+]: Secondary | ICD-10-CM | POA: Diagnosis not present

## 2017-10-27 DIAGNOSIS — M79672 Pain in left foot: Secondary | ICD-10-CM | POA: Diagnosis not present

## 2017-10-27 DIAGNOSIS — M2042 Other hammer toe(s) (acquired), left foot: Secondary | ICD-10-CM | POA: Diagnosis not present

## 2017-10-27 DIAGNOSIS — M8580 Other specified disorders of bone density and structure, unspecified site: Secondary | ICD-10-CM | POA: Diagnosis not present

## 2017-10-27 DIAGNOSIS — R410 Disorientation, unspecified: Secondary | ICD-10-CM | POA: Diagnosis not present

## 2017-10-27 DIAGNOSIS — Z86011 Personal history of benign neoplasm of the brain: Secondary | ICD-10-CM | POA: Diagnosis not present

## 2017-10-27 DIAGNOSIS — M79673 Pain in unspecified foot: Secondary | ICD-10-CM | POA: Diagnosis not present

## 2017-10-28 DIAGNOSIS — M75101 Unspecified rotator cuff tear or rupture of right shoulder, not specified as traumatic: Secondary | ICD-10-CM | POA: Diagnosis not present

## 2017-10-28 DIAGNOSIS — R0789 Other chest pain: Secondary | ICD-10-CM | POA: Diagnosis not present

## 2017-10-28 DIAGNOSIS — Z1151 Encounter for screening for human papillomavirus (HPV): Secondary | ICD-10-CM | POA: Diagnosis not present

## 2017-10-28 DIAGNOSIS — M25519 Pain in unspecified shoulder: Secondary | ICD-10-CM | POA: Diagnosis not present

## 2017-10-28 DIAGNOSIS — R918 Other nonspecific abnormal finding of lung field: Secondary | ICD-10-CM | POA: Diagnosis not present

## 2017-10-28 DIAGNOSIS — M8588 Other specified disorders of bone density and structure, other site: Secondary | ICD-10-CM | POA: Diagnosis not present

## 2017-10-28 DIAGNOSIS — M25511 Pain in right shoulder: Secondary | ICD-10-CM | POA: Diagnosis not present

## 2017-10-28 DIAGNOSIS — Z170 Estrogen receptor positive status [ER+]: Secondary | ICD-10-CM | POA: Diagnosis not present

## 2017-10-28 DIAGNOSIS — C50011 Malignant neoplasm of nipple and areola, right female breast: Secondary | ICD-10-CM | POA: Diagnosis not present

## 2017-10-28 DIAGNOSIS — Z124 Encounter for screening for malignant neoplasm of cervix: Secondary | ICD-10-CM | POA: Diagnosis not present

## 2017-10-28 DIAGNOSIS — M7541 Impingement syndrome of right shoulder: Secondary | ICD-10-CM | POA: Diagnosis not present

## 2017-10-28 DIAGNOSIS — Z86011 Personal history of benign neoplasm of the brain: Secondary | ICD-10-CM | POA: Diagnosis not present

## 2017-10-29 DIAGNOSIS — Z170 Estrogen receptor positive status [ER+]: Secondary | ICD-10-CM | POA: Diagnosis not present

## 2017-10-29 DIAGNOSIS — C50011 Malignant neoplasm of nipple and areola, right female breast: Secondary | ICD-10-CM | POA: Diagnosis not present

## 2017-10-30 DIAGNOSIS — Z170 Estrogen receptor positive status [ER+]: Secondary | ICD-10-CM | POA: Diagnosis not present

## 2017-10-30 DIAGNOSIS — C50011 Malignant neoplasm of nipple and areola, right female breast: Secondary | ICD-10-CM | POA: Diagnosis not present

## 2017-10-31 DIAGNOSIS — C50011 Malignant neoplasm of nipple and areola, right female breast: Secondary | ICD-10-CM | POA: Diagnosis not present

## 2017-10-31 DIAGNOSIS — Z170 Estrogen receptor positive status [ER+]: Secondary | ICD-10-CM | POA: Diagnosis not present

## 2017-11-03 DIAGNOSIS — C50011 Malignant neoplasm of nipple and areola, right female breast: Secondary | ICD-10-CM | POA: Diagnosis not present

## 2017-11-03 DIAGNOSIS — Z170 Estrogen receptor positive status [ER+]: Secondary | ICD-10-CM | POA: Diagnosis not present

## 2017-11-04 DIAGNOSIS — C50011 Malignant neoplasm of nipple and areola, right female breast: Secondary | ICD-10-CM | POA: Diagnosis not present

## 2017-11-04 DIAGNOSIS — Z170 Estrogen receptor positive status [ER+]: Secondary | ICD-10-CM | POA: Diagnosis not present

## 2017-11-05 DIAGNOSIS — Z170 Estrogen receptor positive status [ER+]: Secondary | ICD-10-CM | POA: Diagnosis not present

## 2017-11-05 DIAGNOSIS — C50011 Malignant neoplasm of nipple and areola, right female breast: Secondary | ICD-10-CM | POA: Diagnosis not present

## 2017-11-10 DIAGNOSIS — C50011 Malignant neoplasm of nipple and areola, right female breast: Secondary | ICD-10-CM | POA: Diagnosis not present

## 2017-11-10 DIAGNOSIS — Z170 Estrogen receptor positive status [ER+]: Secondary | ICD-10-CM | POA: Diagnosis not present

## 2017-11-11 DIAGNOSIS — Z170 Estrogen receptor positive status [ER+]: Secondary | ICD-10-CM | POA: Diagnosis not present

## 2017-11-11 DIAGNOSIS — C50011 Malignant neoplasm of nipple and areola, right female breast: Secondary | ICD-10-CM | POA: Diagnosis not present

## 2017-11-12 DIAGNOSIS — Z170 Estrogen receptor positive status [ER+]: Secondary | ICD-10-CM | POA: Diagnosis not present

## 2017-11-12 DIAGNOSIS — C50011 Malignant neoplasm of nipple and areola, right female breast: Secondary | ICD-10-CM | POA: Diagnosis not present

## 2017-11-13 DIAGNOSIS — C50011 Malignant neoplasm of nipple and areola, right female breast: Secondary | ICD-10-CM | POA: Diagnosis not present

## 2017-11-13 DIAGNOSIS — Z170 Estrogen receptor positive status [ER+]: Secondary | ICD-10-CM | POA: Diagnosis not present

## 2017-11-14 DIAGNOSIS — C50011 Malignant neoplasm of nipple and areola, right female breast: Secondary | ICD-10-CM | POA: Diagnosis not present

## 2017-11-14 DIAGNOSIS — Z170 Estrogen receptor positive status [ER+]: Secondary | ICD-10-CM | POA: Diagnosis not present

## 2017-11-17 DIAGNOSIS — C50011 Malignant neoplasm of nipple and areola, right female breast: Secondary | ICD-10-CM | POA: Diagnosis not present

## 2017-11-17 DIAGNOSIS — Z170 Estrogen receptor positive status [ER+]: Secondary | ICD-10-CM | POA: Diagnosis not present

## 2017-11-17 DIAGNOSIS — D0511 Intraductal carcinoma in situ of right breast: Secondary | ICD-10-CM | POA: Diagnosis not present

## 2017-11-18 DIAGNOSIS — Z170 Estrogen receptor positive status [ER+]: Secondary | ICD-10-CM | POA: Diagnosis not present

## 2017-11-18 DIAGNOSIS — D0511 Intraductal carcinoma in situ of right breast: Secondary | ICD-10-CM | POA: Diagnosis not present

## 2017-11-18 DIAGNOSIS — C50011 Malignant neoplasm of nipple and areola, right female breast: Secondary | ICD-10-CM | POA: Diagnosis not present

## 2017-11-19 DIAGNOSIS — D0511 Intraductal carcinoma in situ of right breast: Secondary | ICD-10-CM | POA: Diagnosis not present

## 2017-11-19 DIAGNOSIS — Z170 Estrogen receptor positive status [ER+]: Secondary | ICD-10-CM | POA: Diagnosis not present

## 2017-11-19 DIAGNOSIS — C50011 Malignant neoplasm of nipple and areola, right female breast: Secondary | ICD-10-CM | POA: Diagnosis not present

## 2017-11-20 DIAGNOSIS — C50011 Malignant neoplasm of nipple and areola, right female breast: Secondary | ICD-10-CM | POA: Diagnosis not present

## 2017-11-20 DIAGNOSIS — D0511 Intraductal carcinoma in situ of right breast: Secondary | ICD-10-CM | POA: Diagnosis not present

## 2017-11-20 DIAGNOSIS — Z170 Estrogen receptor positive status [ER+]: Secondary | ICD-10-CM | POA: Diagnosis not present

## 2017-11-21 DIAGNOSIS — Z170 Estrogen receptor positive status [ER+]: Secondary | ICD-10-CM | POA: Diagnosis not present

## 2017-11-21 DIAGNOSIS — C50011 Malignant neoplasm of nipple and areola, right female breast: Secondary | ICD-10-CM | POA: Diagnosis not present

## 2017-11-21 DIAGNOSIS — D0511 Intraductal carcinoma in situ of right breast: Secondary | ICD-10-CM | POA: Diagnosis not present

## 2017-12-02 DIAGNOSIS — Z1211 Encounter for screening for malignant neoplasm of colon: Secondary | ICD-10-CM | POA: Diagnosis not present

## 2017-12-02 DIAGNOSIS — Z980 Intestinal bypass and anastomosis status: Secondary | ICD-10-CM | POA: Diagnosis not present

## 2017-12-02 DIAGNOSIS — D122 Benign neoplasm of ascending colon: Secondary | ICD-10-CM | POA: Diagnosis not present

## 2017-12-02 DIAGNOSIS — R1013 Epigastric pain: Secondary | ICD-10-CM | POA: Diagnosis not present

## 2017-12-02 DIAGNOSIS — C50011 Malignant neoplasm of nipple and areola, right female breast: Secondary | ICD-10-CM | POA: Diagnosis not present

## 2018-01-09 DIAGNOSIS — R4189 Other symptoms and signs involving cognitive functions and awareness: Secondary | ICD-10-CM | POA: Diagnosis not present

## 2018-01-09 DIAGNOSIS — D0511 Intraductal carcinoma in situ of right breast: Secondary | ICD-10-CM | POA: Diagnosis not present

## 2018-01-09 DIAGNOSIS — R030 Elevated blood-pressure reading, without diagnosis of hypertension: Secondary | ICD-10-CM | POA: Diagnosis not present

## 2018-01-09 DIAGNOSIS — R197 Diarrhea, unspecified: Secondary | ICD-10-CM | POA: Diagnosis not present

## 2018-01-09 DIAGNOSIS — Z170 Estrogen receptor positive status [ER+]: Secondary | ICD-10-CM | POA: Diagnosis not present

## 2018-01-09 DIAGNOSIS — Z9011 Acquired absence of right breast and nipple: Secondary | ICD-10-CM | POA: Diagnosis not present

## 2018-02-03 DIAGNOSIS — I10 Essential (primary) hypertension: Secondary | ICD-10-CM | POA: Diagnosis not present

## 2018-02-03 DIAGNOSIS — R0602 Shortness of breath: Secondary | ICD-10-CM | POA: Diagnosis not present

## 2018-02-03 DIAGNOSIS — R0789 Other chest pain: Secondary | ICD-10-CM | POA: Diagnosis not present

## 2018-02-03 DIAGNOSIS — R002 Palpitations: Secondary | ICD-10-CM | POA: Diagnosis not present

## 2018-03-19 DIAGNOSIS — R918 Other nonspecific abnormal finding of lung field: Secondary | ICD-10-CM | POA: Diagnosis not present

## 2018-03-19 DIAGNOSIS — R41840 Attention and concentration deficit: Secondary | ICD-10-CM | POA: Diagnosis not present

## 2018-03-19 DIAGNOSIS — C50011 Malignant neoplasm of nipple and areola, right female breast: Secondary | ICD-10-CM | POA: Diagnosis not present

## 2018-03-19 DIAGNOSIS — R5383 Other fatigue: Secondary | ICD-10-CM | POA: Diagnosis not present

## 2018-03-19 DIAGNOSIS — M810 Age-related osteoporosis without current pathological fracture: Secondary | ICD-10-CM | POA: Diagnosis not present

## 2018-03-19 DIAGNOSIS — J8410 Pulmonary fibrosis, unspecified: Secondary | ICD-10-CM | POA: Diagnosis not present

## 2018-03-19 DIAGNOSIS — R413 Other amnesia: Secondary | ICD-10-CM | POA: Diagnosis not present

## 2018-03-19 DIAGNOSIS — R0602 Shortness of breath: Secondary | ICD-10-CM | POA: Diagnosis not present

## 2018-03-19 DIAGNOSIS — J9811 Atelectasis: Secondary | ICD-10-CM | POA: Diagnosis not present

## 2018-03-19 DIAGNOSIS — Z170 Estrogen receptor positive status [ER+]: Secondary | ICD-10-CM | POA: Diagnosis not present

## 2018-03-19 DIAGNOSIS — Z13820 Encounter for screening for osteoporosis: Secondary | ICD-10-CM | POA: Diagnosis not present

## 2018-04-21 DIAGNOSIS — E042 Nontoxic multinodular goiter: Secondary | ICD-10-CM | POA: Diagnosis not present

## 2018-04-21 DIAGNOSIS — H579 Unspecified disorder of eye and adnexa: Secondary | ICD-10-CM | POA: Diagnosis not present

## 2018-04-21 DIAGNOSIS — E538 Deficiency of other specified B group vitamins: Secondary | ICD-10-CM | POA: Diagnosis not present

## 2018-05-15 DIAGNOSIS — E041 Nontoxic single thyroid nodule: Secondary | ICD-10-CM | POA: Diagnosis not present

## 2018-05-15 DIAGNOSIS — D34 Benign neoplasm of thyroid gland: Secondary | ICD-10-CM | POA: Diagnosis not present

## 2018-05-20 DIAGNOSIS — D518 Other vitamin B12 deficiency anemias: Secondary | ICD-10-CM | POA: Diagnosis not present

## 2018-06-17 DIAGNOSIS — D518 Other vitamin B12 deficiency anemias: Secondary | ICD-10-CM | POA: Diagnosis not present

## 2018-07-01 DIAGNOSIS — R918 Other nonspecific abnormal finding of lung field: Secondary | ICD-10-CM | POA: Diagnosis not present

## 2018-07-01 DIAGNOSIS — Z79811 Long term (current) use of aromatase inhibitors: Secondary | ICD-10-CM | POA: Diagnosis not present

## 2018-07-01 DIAGNOSIS — J479 Bronchiectasis, uncomplicated: Secondary | ICD-10-CM | POA: Diagnosis not present

## 2018-07-01 DIAGNOSIS — R9389 Abnormal findings on diagnostic imaging of other specified body structures: Secondary | ICD-10-CM | POA: Diagnosis not present

## 2018-07-01 DIAGNOSIS — L819 Disorder of pigmentation, unspecified: Secondary | ICD-10-CM | POA: Diagnosis not present

## 2018-07-01 DIAGNOSIS — L989 Disorder of the skin and subcutaneous tissue, unspecified: Secondary | ICD-10-CM | POA: Diagnosis not present

## 2018-07-01 DIAGNOSIS — N644 Mastodynia: Secondary | ICD-10-CM | POA: Diagnosis not present

## 2018-07-01 DIAGNOSIS — D485 Neoplasm of uncertain behavior of skin: Secondary | ICD-10-CM | POA: Diagnosis not present

## 2018-07-01 DIAGNOSIS — D0511 Intraductal carcinoma in situ of right breast: Secondary | ICD-10-CM | POA: Diagnosis not present

## 2018-07-01 DIAGNOSIS — Z853 Personal history of malignant neoplasm of breast: Secondary | ICD-10-CM | POA: Diagnosis not present

## 2018-07-01 DIAGNOSIS — D132 Benign neoplasm of duodenum: Secondary | ICD-10-CM | POA: Diagnosis not present

## 2018-07-01 DIAGNOSIS — D0590 Unspecified type of carcinoma in situ of unspecified breast: Secondary | ICD-10-CM | POA: Diagnosis not present

## 2018-07-01 DIAGNOSIS — L731 Pseudofolliculitis barbae: Secondary | ICD-10-CM | POA: Diagnosis not present

## 2018-07-07 DIAGNOSIS — R5381 Other malaise: Secondary | ICD-10-CM | POA: Diagnosis not present

## 2018-07-07 DIAGNOSIS — R197 Diarrhea, unspecified: Secondary | ICD-10-CM | POA: Diagnosis not present

## 2018-07-07 DIAGNOSIS — R911 Solitary pulmonary nodule: Secondary | ICD-10-CM | POA: Diagnosis not present

## 2018-07-07 DIAGNOSIS — E538 Deficiency of other specified B group vitamins: Secondary | ICD-10-CM | POA: Diagnosis not present

## 2018-07-07 DIAGNOSIS — F419 Anxiety disorder, unspecified: Secondary | ICD-10-CM | POA: Diagnosis not present

## 2018-07-07 DIAGNOSIS — M79641 Pain in right hand: Secondary | ICD-10-CM | POA: Diagnosis not present

## 2018-07-07 DIAGNOSIS — M79642 Pain in left hand: Secondary | ICD-10-CM | POA: Diagnosis not present

## 2018-07-20 DIAGNOSIS — D483 Neoplasm of uncertain behavior of retroperitoneum: Secondary | ICD-10-CM | POA: Diagnosis not present

## 2018-07-21 DIAGNOSIS — I10 Essential (primary) hypertension: Secondary | ICD-10-CM | POA: Diagnosis not present

## 2018-07-21 DIAGNOSIS — Z9011 Acquired absence of right breast and nipple: Secondary | ICD-10-CM | POA: Diagnosis not present

## 2018-07-21 DIAGNOSIS — H547 Unspecified visual loss: Secondary | ICD-10-CM | POA: Diagnosis not present

## 2018-07-21 DIAGNOSIS — Z170 Estrogen receptor positive status [ER+]: Secondary | ICD-10-CM | POA: Diagnosis not present

## 2018-07-21 DIAGNOSIS — Z86000 Personal history of in-situ neoplasm of breast: Secondary | ICD-10-CM | POA: Diagnosis not present

## 2018-07-21 DIAGNOSIS — H539 Unspecified visual disturbance: Secondary | ICD-10-CM | POA: Diagnosis not present

## 2018-07-21 DIAGNOSIS — Z09 Encounter for follow-up examination after completed treatment for conditions other than malignant neoplasm: Secondary | ICD-10-CM | POA: Diagnosis not present

## 2018-07-21 DIAGNOSIS — R002 Palpitations: Secondary | ICD-10-CM | POA: Diagnosis not present

## 2018-07-21 DIAGNOSIS — R413 Other amnesia: Secondary | ICD-10-CM | POA: Diagnosis not present

## 2018-07-21 DIAGNOSIS — R41840 Attention and concentration deficit: Secondary | ICD-10-CM | POA: Diagnosis not present

## 2018-07-21 DIAGNOSIS — R9431 Abnormal electrocardiogram [ECG] [EKG]: Secondary | ICD-10-CM | POA: Diagnosis not present

## 2018-07-21 DIAGNOSIS — I499 Cardiac arrhythmia, unspecified: Secondary | ICD-10-CM | POA: Diagnosis not present

## 2018-07-21 DIAGNOSIS — Z79811 Long term (current) use of aromatase inhibitors: Secondary | ICD-10-CM | POA: Diagnosis not present

## 2018-07-21 DIAGNOSIS — C50011 Malignant neoplasm of nipple and areola, right female breast: Secondary | ICD-10-CM | POA: Diagnosis not present

## 2018-07-22 DIAGNOSIS — D518 Other vitamin B12 deficiency anemias: Secondary | ICD-10-CM | POA: Diagnosis not present

## 2018-07-28 DIAGNOSIS — C44521 Squamous cell carcinoma of skin of breast: Secondary | ICD-10-CM | POA: Diagnosis not present

## 2018-09-01 DIAGNOSIS — C50019 Malignant neoplasm of nipple and areola, unspecified female breast: Secondary | ICD-10-CM | POA: Diagnosis not present

## 2018-09-01 DIAGNOSIS — I471 Supraventricular tachycardia: Secondary | ICD-10-CM | POA: Diagnosis not present

## 2018-09-01 DIAGNOSIS — R002 Palpitations: Secondary | ICD-10-CM | POA: Diagnosis not present

## 2018-09-01 DIAGNOSIS — I4891 Unspecified atrial fibrillation: Secondary | ICD-10-CM | POA: Diagnosis not present

## 2018-09-02 DIAGNOSIS — M25561 Pain in right knee: Secondary | ICD-10-CM | POA: Diagnosis not present

## 2018-09-02 DIAGNOSIS — Z1283 Encounter for screening for malignant neoplasm of skin: Secondary | ICD-10-CM | POA: Diagnosis not present

## 2018-09-02 DIAGNOSIS — F419 Anxiety disorder, unspecified: Secondary | ICD-10-CM | POA: Diagnosis not present

## 2018-09-02 DIAGNOSIS — I10 Essential (primary) hypertension: Secondary | ICD-10-CM | POA: Diagnosis not present

## 2018-09-02 DIAGNOSIS — Z86000 Personal history of in-situ neoplasm of breast: Secondary | ICD-10-CM | POA: Diagnosis not present

## 2018-09-02 DIAGNOSIS — B372 Candidiasis of skin and nail: Secondary | ICD-10-CM | POA: Diagnosis not present

## 2018-09-02 DIAGNOSIS — E538 Deficiency of other specified B group vitamins: Secondary | ICD-10-CM | POA: Diagnosis not present

## 2018-09-02 DIAGNOSIS — R000 Tachycardia, unspecified: Secondary | ICD-10-CM | POA: Diagnosis not present

## 2018-09-02 DIAGNOSIS — R002 Palpitations: Secondary | ICD-10-CM | POA: Diagnosis not present

## 2018-09-02 DIAGNOSIS — Z85828 Personal history of other malignant neoplasm of skin: Secondary | ICD-10-CM | POA: Diagnosis not present

## 2018-09-02 DIAGNOSIS — M25562 Pain in left knee: Secondary | ICD-10-CM | POA: Diagnosis not present

## 2018-09-21 DIAGNOSIS — E538 Deficiency of other specified B group vitamins: Secondary | ICD-10-CM | POA: Diagnosis not present

## 2018-09-21 DIAGNOSIS — R197 Diarrhea, unspecified: Secondary | ICD-10-CM | POA: Diagnosis not present

## 2018-09-21 DIAGNOSIS — Z9114 Patient's other noncompliance with medication regimen: Secondary | ICD-10-CM | POA: Diagnosis not present

## 2018-09-21 DIAGNOSIS — L811 Chloasma: Secondary | ICD-10-CM | POA: Diagnosis not present

## 2018-10-14 DIAGNOSIS — D518 Other vitamin B12 deficiency anemias: Secondary | ICD-10-CM | POA: Diagnosis not present

## 2018-10-29 DIAGNOSIS — K219 Gastro-esophageal reflux disease without esophagitis: Secondary | ICD-10-CM | POA: Diagnosis not present

## 2018-10-29 DIAGNOSIS — K224 Dyskinesia of esophagus: Secondary | ICD-10-CM | POA: Diagnosis not present

## 2018-10-29 DIAGNOSIS — K5710 Diverticulosis of small intestine without perforation or abscess without bleeding: Secondary | ICD-10-CM | POA: Diagnosis not present

## 2018-10-29 DIAGNOSIS — I499 Cardiac arrhythmia, unspecified: Secondary | ICD-10-CM | POA: Diagnosis not present

## 2018-10-29 DIAGNOSIS — I480 Paroxysmal atrial fibrillation: Secondary | ICD-10-CM | POA: Diagnosis not present

## 2018-10-29 DIAGNOSIS — R197 Diarrhea, unspecified: Secondary | ICD-10-CM | POA: Diagnosis not present

## 2018-10-30 DIAGNOSIS — E559 Vitamin D deficiency, unspecified: Secondary | ICD-10-CM | POA: Diagnosis not present

## 2018-10-30 DIAGNOSIS — I10 Essential (primary) hypertension: Secondary | ICD-10-CM | POA: Diagnosis not present

## 2018-10-30 DIAGNOSIS — E042 Nontoxic multinodular goiter: Secondary | ICD-10-CM | POA: Diagnosis not present

## 2018-10-30 DIAGNOSIS — Z79899 Other long term (current) drug therapy: Secondary | ICD-10-CM | POA: Diagnosis not present

## 2018-10-30 DIAGNOSIS — R197 Diarrhea, unspecified: Secondary | ICD-10-CM | POA: Diagnosis not present

## 2018-10-30 DIAGNOSIS — E538 Deficiency of other specified B group vitamins: Secondary | ICD-10-CM | POA: Diagnosis not present

## 2018-10-30 DIAGNOSIS — J984 Other disorders of lung: Secondary | ICD-10-CM | POA: Diagnosis not present

## 2018-10-30 DIAGNOSIS — R918 Other nonspecific abnormal finding of lung field: Secondary | ICD-10-CM | POA: Diagnosis not present

## 2018-11-24 DIAGNOSIS — I517 Cardiomegaly: Secondary | ICD-10-CM | POA: Diagnosis not present

## 2018-11-24 DIAGNOSIS — I480 Paroxysmal atrial fibrillation: Secondary | ICD-10-CM | POA: Diagnosis not present

## 2018-11-27 DIAGNOSIS — L811 Chloasma: Secondary | ICD-10-CM | POA: Diagnosis not present

## 2018-11-27 DIAGNOSIS — Z85828 Personal history of other malignant neoplasm of skin: Secondary | ICD-10-CM | POA: Diagnosis not present

## 2018-12-01 DIAGNOSIS — I480 Paroxysmal atrial fibrillation: Secondary | ICD-10-CM | POA: Diagnosis not present

## 2018-12-01 DIAGNOSIS — E538 Deficiency of other specified B group vitamins: Secondary | ICD-10-CM | POA: Diagnosis not present

## 2018-12-01 DIAGNOSIS — Z79899 Other long term (current) drug therapy: Secondary | ICD-10-CM | POA: Diagnosis not present

## 2018-12-01 DIAGNOSIS — R197 Diarrhea, unspecified: Secondary | ICD-10-CM | POA: Diagnosis not present

## 2018-12-01 DIAGNOSIS — E559 Vitamin D deficiency, unspecified: Secondary | ICD-10-CM | POA: Diagnosis not present

## 2018-12-01 DIAGNOSIS — I10 Essential (primary) hypertension: Secondary | ICD-10-CM | POA: Diagnosis not present

## 2018-12-01 DIAGNOSIS — I4891 Unspecified atrial fibrillation: Secondary | ICD-10-CM | POA: Diagnosis present

## 2018-12-01 DIAGNOSIS — Z86010 Personal history of colonic polyps: Secondary | ICD-10-CM | POA: Diagnosis not present

## 2019-08-09 ENCOUNTER — Other Ambulatory Visit: Payer: Self-pay | Admitting: *Deleted

## 2019-08-09 ENCOUNTER — Other Ambulatory Visit: Payer: Self-pay | Admitting: Internal Medicine

## 2019-08-09 DIAGNOSIS — R519 Headache, unspecified: Secondary | ICD-10-CM

## 2019-08-10 ENCOUNTER — Ambulatory Visit
Admission: RE | Admit: 2019-08-10 | Discharge: 2019-08-10 | Disposition: A | Discharge status: Home / Self Care | Payer: Medicare Other | Source: Ambulatory Visit | Attending: Internal Medicine | Admitting: Internal Medicine

## 2019-08-10 DIAGNOSIS — R519 Headache, unspecified: Secondary | ICD-10-CM

## 2020-04-11 ENCOUNTER — Telehealth: Payer: Self-pay | Admitting: Family Medicine

## 2020-04-11 NOTE — Telephone Encounter (Signed)
Pt is wondering when she was taking the B12 inj -how much was she getting? Pt only wants to speak to Anita Dickerson.   Tried to offer pt to set up a TOC from [facility] but pt declined  Pt can be reached at [phone]

## 2020-04-12 NOTE — Telephone Encounter (Signed)
Spoke with patient and reviewed B12 injection dosage.  Patient again declined a TOC appointment with a new provider.

## 2021-07-08 IMAGING — CT CT HEAD WITHOUT CONTRAST
3 of 4 series · 16 of 47 positions shown, 19 images · non-contrast
Comparison: August 30, 2017

CLINICAL DATA: Headache and dizziness. Injury approximately 1 month
prior

EXAM:
CT HEAD WITHOUT CONTRAST
TECHNIQUE: Contiguous axial images were obtained from the base of the skull
through the vertex without intravenous contrast.

[Series 2: head 5.00 hr40 s3 axial ibhc · axial · 0.44mm/px · z∈[-633,-493]mm · 10 of 34 slices shown, 13 images]
[im 3/34  brain]
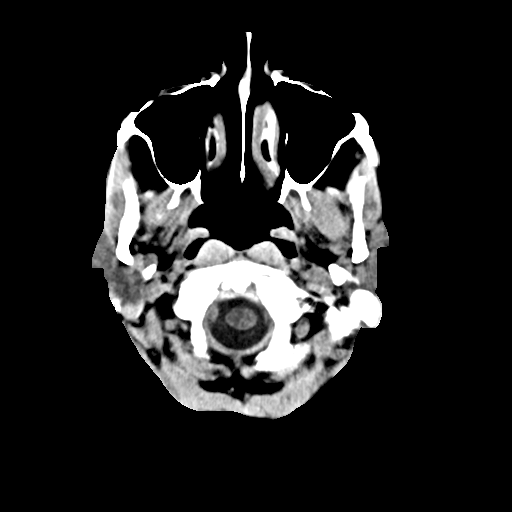
[im 3/34  bone]
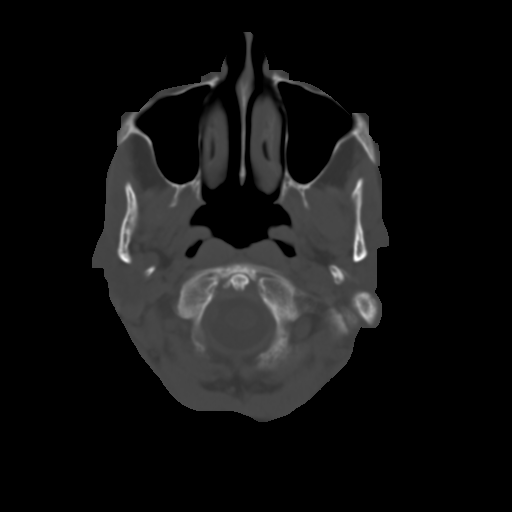
[im 5/34  brain]
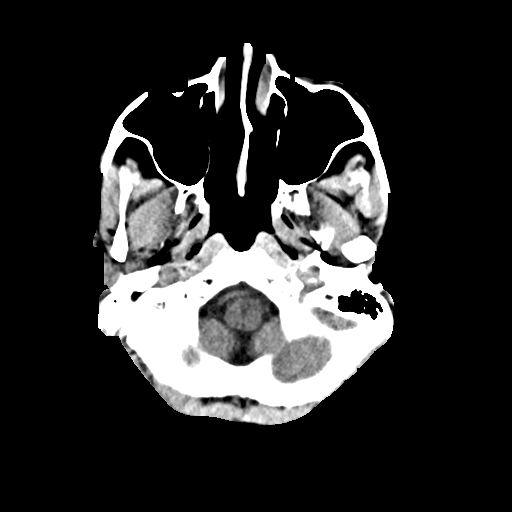
[im 10/34  brain]
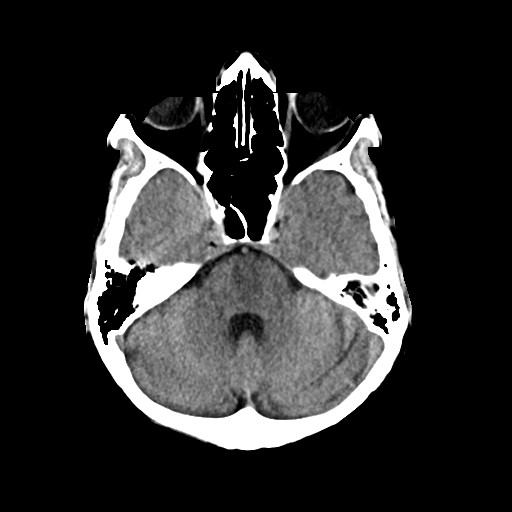
[im 12/34  brain]
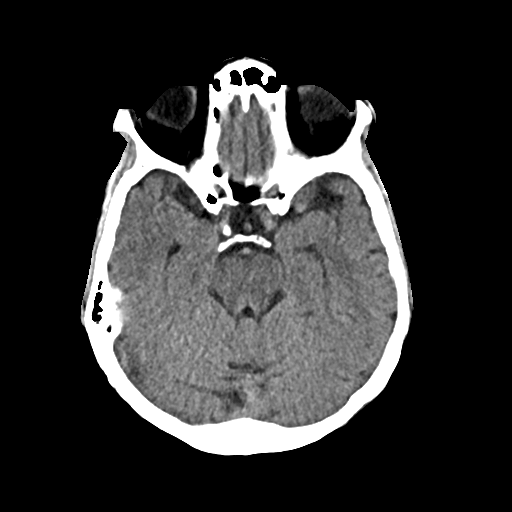
[im 15/34  brain]
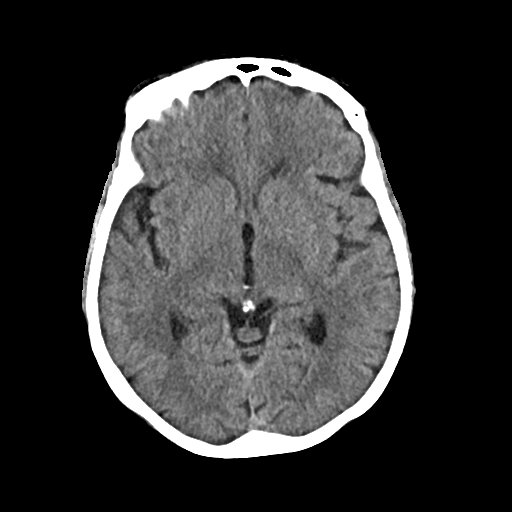
[im 15/34  bone]
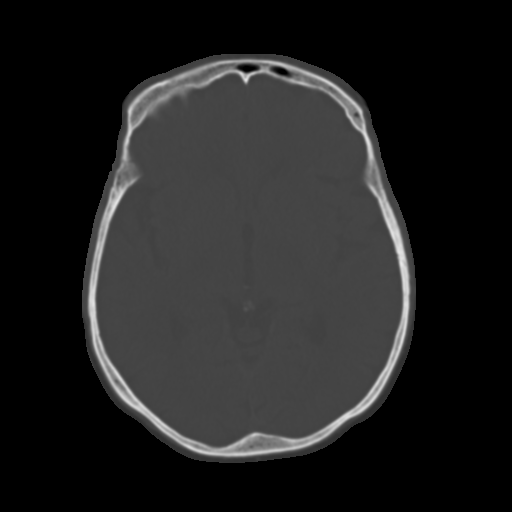
[im 19/34  brain]
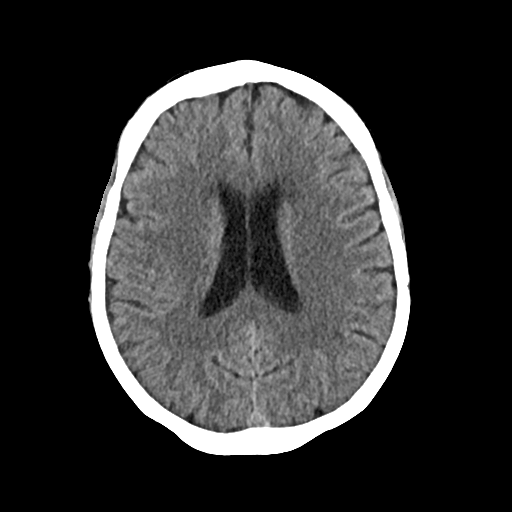
[im 22/34  brain]
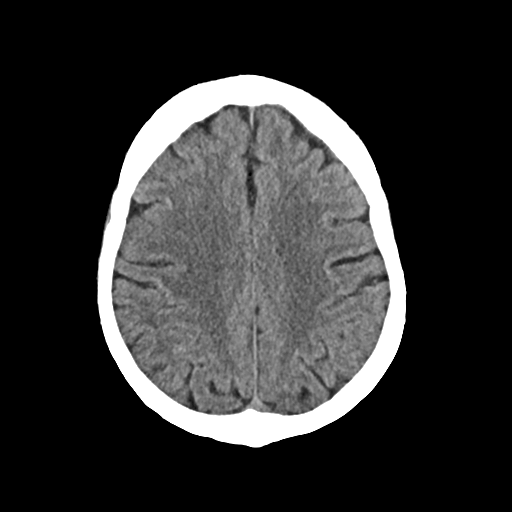
[im 24/34  brain]
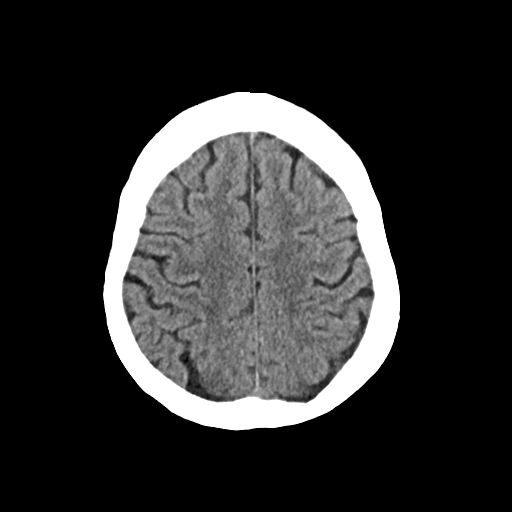
[im 29/34  brain]
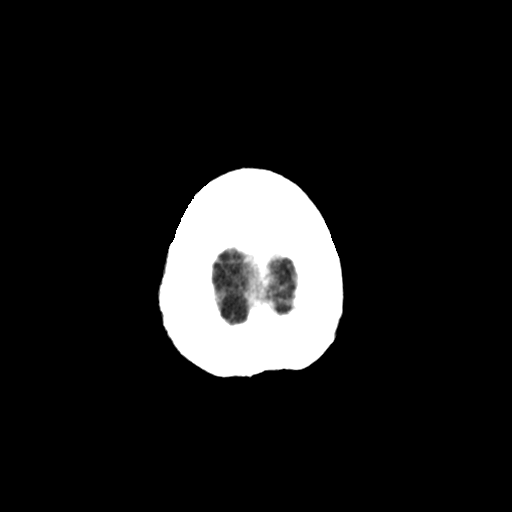
[im 29/34  bone]
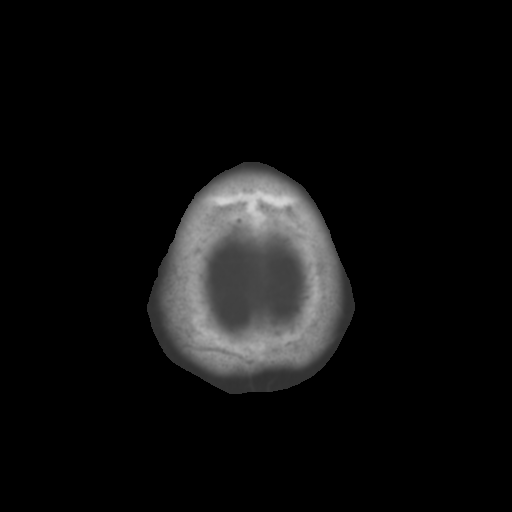
[im 31/34  brain]
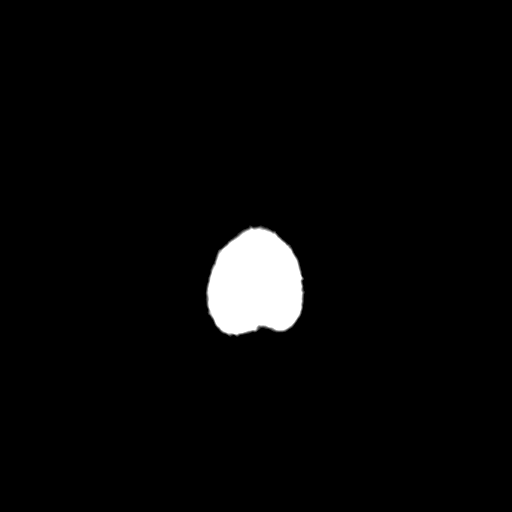

[Series 4: head 3.00 hr40 s3 sag · sagittal · 0.33mm/px · 3 of 59 slices shown]
[im 20/59  brain]
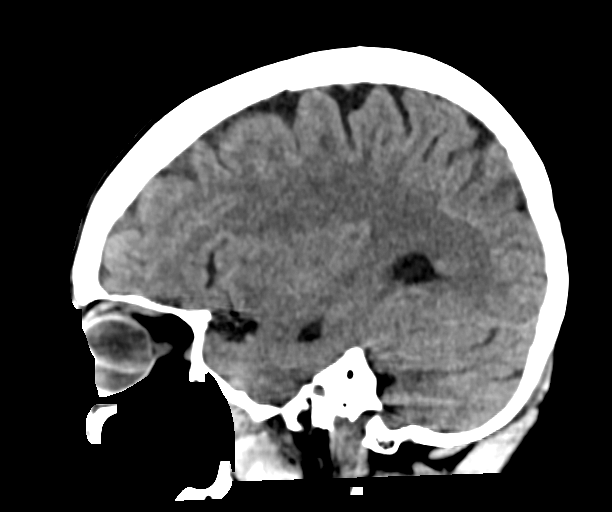
[im 30/59  brain]
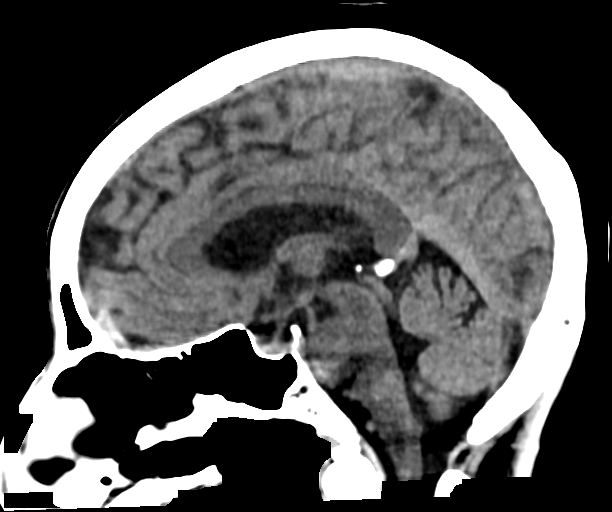
[im 39/59  brain]
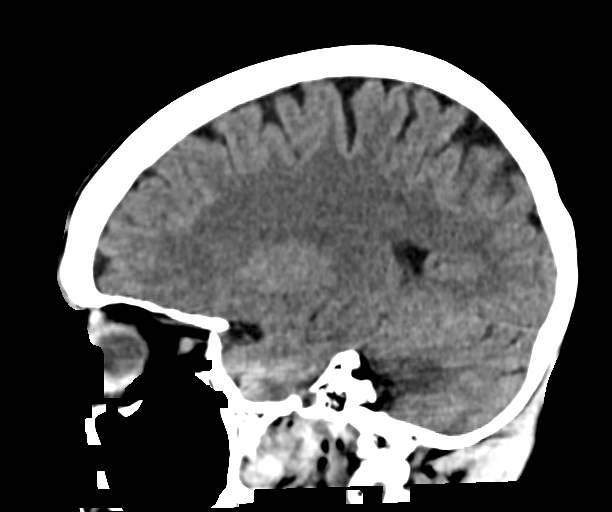

[Series 6: head 3.00 hr40 s3 cor · coronal · 0.33mm/px · 3 of 66 slices shown]
[im 22/66  brain]
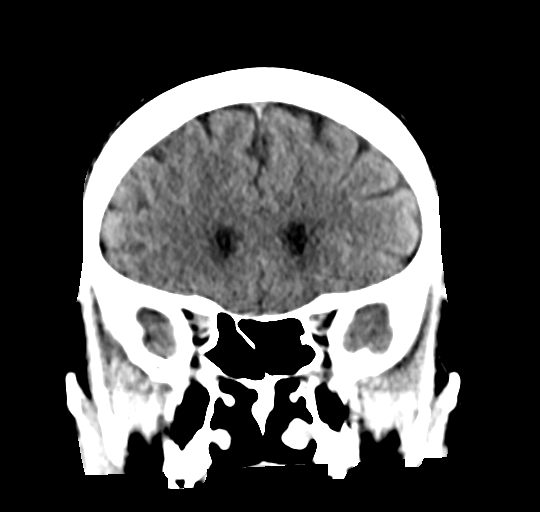
[im 29/66  brain]
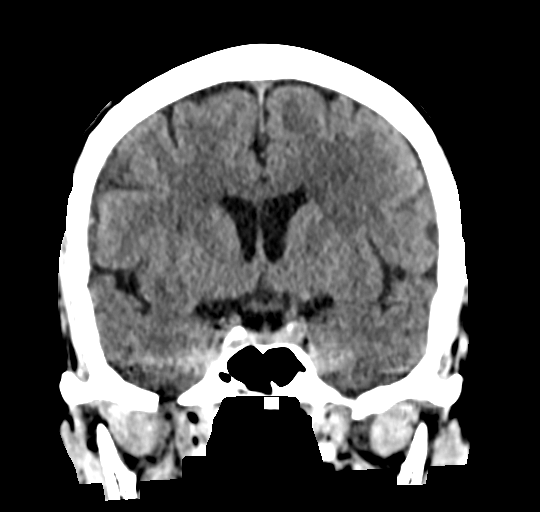
[im 37/66  brain]
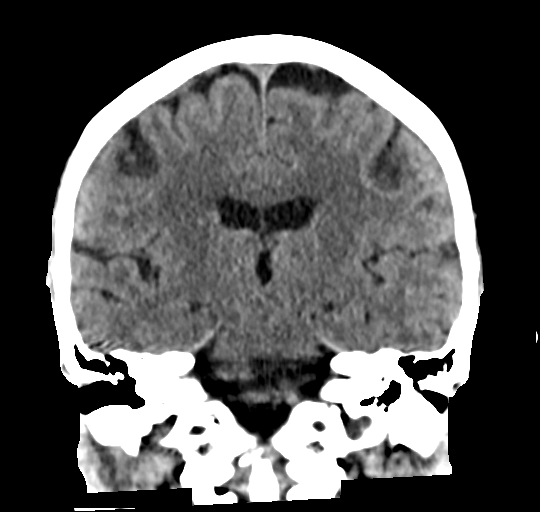

[16 of 47 positions shown; findings below may reference images not displayed]

FINDINGS: Brain: The ventricles are normal in size and configuration. There is
no intracranial mass, hemorrhage, extra-axial fluid collection, or
midline shift. There is mild patchy small vessel disease in the
centra semiovale bilaterally. Elsewhere brain parenchyma appears
unremarkable. No acute infarct is evident.

Vascular: There is no hyperdense vessel. There is calcification in
each carotid siphon region.

Skull: Bony calvarium appears intact.

Sinuses/Orbits: Visualized paranasal sinuses are clear. Orbits
appear symmetric bilaterally.

Other: Mastoid air cells are clear.
IMPRESSION: Mild periventricular small vessel disease. No acute infarct. No mass
or hemorrhage.

There are foci of arterial vascular calcification.

## 2022-05-01 ENCOUNTER — Ambulatory Visit (LOCAL_COMMUNITY_HEALTH_CENTER): Payer: Medicare Other

## 2022-05-01 DIAGNOSIS — Z23 Encounter for immunization: Secondary | ICD-10-CM | POA: Diagnosis not present

## 2022-05-01 DIAGNOSIS — Z719 Counseling, unspecified: Secondary | ICD-10-CM

## 2022-05-01 NOTE — Progress Notes (Signed)
?  Are you feeling sick today? No ? ? ?Have you ever received a dose of COVID-19 Vaccine? [employer], [place], [place], [employer], Other) Yes ? ?If yes, which vaccine and how many doses?   5 Pfizer doses ? ? ?Did you bring the vaccination record card or other documentation?  Yes ? ? ?Do you have a health condition or are undergoing treatment that makes you moderately or severely immunocompromised? This would include, but not be limited to: cancer, HIV, organ transplant, immunosuppressive therapy/high-dose corticosteroids, or moderate/severe primary immunodeficiency.  No ? ?Have you received COVID-19 vaccine before or during hematopoietic cell transplant (HCT) or CAR-T-cell therapies? No ? ?Have you ever had an allergic reaction to: (This would include a severe allergic reaction or a reaction that caused hives, swelling, or respiratory distress, including wheezing.) A component of a COVID-19 vaccine or a previous dose of COVID-19 vaccine? No ? ? ?Have you ever had an allergic reaction to another vaccine (other thanCOVID-19 vaccine) or an injectable medication? (This would include a severe allergic reaction or a reaction that caused hives, swelling, or respiratory distress, including wheezing.)   Yes ? ?  ?Do you have a history of any of the following: ? ?Myocarditis or Pericarditis No ? ?Dermal fillers:  No ? ?Multisystem Inflammatory Syndrome (MIS-C or MIS-A)? No ? ?COVID-19 disease within the past 3 months? No ? ?Vaccinated with monkeypox vaccine in the last 4 weeks? No ? ?Tolerated vaccine well. NCIR updated and copy given to pt.Vaccine card also given to pt. ?  ?

## 2022-09-25 ENCOUNTER — Ambulatory Visit (LOCAL_COMMUNITY_HEALTH_CENTER): Payer: Medicare Other

## 2022-09-25 DIAGNOSIS — Z23 Encounter for immunization: Secondary | ICD-10-CM | POA: Diagnosis not present

## 2022-09-25 DIAGNOSIS — Z719 Counseling, unspecified: Secondary | ICD-10-CM

## 2022-09-25 NOTE — Progress Notes (Signed)
  Are you feeling sick today? No   Have you ever received a dose of COVID-19 Vaccine? [employer], [place], [place], [place], Other) Yes  If yes, which vaccine and how many doses?  PFIZER, 6   Did you bring the vaccination record card or other documentation?  No   Do you have a health condition or are undergoing treatment that makes you moderately or severely immunocompromised? This would include, but not be limited to: cancer, HIV, organ transplant, immunosuppressive therapy/high-dose corticosteroids, or moderate/severe primary immunodeficiency.  No  Have you received COVID-19 vaccine before or during hematopoietic cell transplant (HCT) or CAR-T-cell therapies? No  Have you ever had an allergic reaction to: (This would include a severe allergic reaction or a reaction that caused hives, swelling, or respiratory distress, including wheezing.) A component of a COVID-19 vaccine or a previous dose of COVID-19 vaccine? No   Have you ever had an allergic reaction to another vaccine (other thanCOVID-19 vaccine) or an injectable medication? (This would include a severe allergic reaction or a reaction that caused hives, swelling, or respiratory distress, including wheezing.)   No    Do you have a history of any of the following:  Myocarditis or Pericarditis No  Dermal fillers:  No  Multisystem Inflammatory Syndrome (MIS-C or MIS-A)? No  COVID-19 disease within the past 3 months? No  Vaccinated with monkeypox vaccine in the last 4 weeks? No  Administered Flu and [place] 12+, 23-24, tolerated well. Verbalized understanding of VIS and NCIR copy. M.Timia Casselman, LPN.

## 2023-08-27 ENCOUNTER — Ambulatory Visit: Payer: Medicare Other

## 2023-08-27 DIAGNOSIS — Z719 Counseling, unspecified: Secondary | ICD-10-CM

## 2023-08-27 DIAGNOSIS — Z23 Encounter for immunization: Secondary | ICD-10-CM | POA: Diagnosis not present

## 2023-08-27 NOTE — Progress Notes (Signed)
In nurse clinic for immunizations. Patient requesting COVID and Flu. Voices no concerns. VIS reviewed and given to patient. Vaccines (Comirnaty 12+, Fluzone High Dose) tolerated well; no issues noted. NCIR updated and copies given to patient.   Abagail Kitchens, RN

## 2023-12-05 ENCOUNTER — Emergency Department (HOSPITAL_COMMUNITY): Payer: Medicare Other

## 2023-12-05 ENCOUNTER — Other Ambulatory Visit: Payer: Self-pay

## 2023-12-05 ENCOUNTER — Encounter (HOSPITAL_COMMUNITY): Payer: Self-pay

## 2023-12-05 ENCOUNTER — Emergency Department (HOSPITAL_COMMUNITY)
Admission: EM | Admit: 2023-12-05 | Discharge: 2023-12-06 | Disposition: A | Discharge status: Home / Self Care | Payer: Medicare Other | Attending: Emergency Medicine | Admitting: Emergency Medicine

## 2023-12-05 DIAGNOSIS — F419 Anxiety disorder, unspecified: Secondary | ICD-10-CM | POA: Insufficient documentation

## 2023-12-05 DIAGNOSIS — R42 Dizziness and giddiness: Secondary | ICD-10-CM | POA: Diagnosis not present

## 2023-12-05 DIAGNOSIS — Z7982 Long term (current) use of aspirin: Secondary | ICD-10-CM | POA: Diagnosis not present

## 2023-12-05 DIAGNOSIS — R002 Palpitations: Secondary | ICD-10-CM | POA: Diagnosis present

## 2023-12-05 HISTORY — DX: Unspecified atrial fibrillation: I48.91

## 2023-12-05 HISTORY — DX: Essential (primary) hypertension: I10

## 2023-12-05 LAB — CBC
HCT: 38.6 % (ref 36.0–46.0)
Hemoglobin: 12.6 g/dL (ref 12.0–15.0)
MCH: 30.1 pg (ref 26.0–34.0)
MCHC: 32.6 g/dL (ref 30.0–36.0)
MCV: 92.3 fL (ref 80.0–100.0)
Platelets: 190 10*3/uL (ref 150–400)
RBC: 4.18 MIL/uL (ref 3.87–5.11)
RDW: 14 % (ref 11.5–15.5)
WBC: 4.7 10*3/uL (ref 4.0–10.5)
nRBC: 0 % (ref 0.0–0.2)

## 2023-12-05 LAB — TROPONIN I (HIGH SENSITIVITY)
Troponin I (High Sensitivity): 8 ng/L (ref <18)
Troponin I (High Sensitivity): 7 ng/L (ref <18)

## 2023-12-05 LAB — BASIC METABOLIC PANEL
Anion gap: 11 (ref 5–15)
BUN: 6 mg/dL — ABNORMAL LOW (ref 8–23)
CO2: 27 mmol/L (ref 22–32)
Calcium: 9.5 mg/dL (ref 8.9–10.3)
Chloride: 95 mmol/L — ABNORMAL LOW (ref 98–111)
Creatinine, Ser: 0.92 mg/dL (ref 0.44–1.00)
GFR, Estimated: >60 mL/min (ref >60)
Glucose, Bld: 99 mg/dL (ref 70–99)
Potassium: 4.1 mmol/L (ref 3.5–5.1)
Sodium: 133 mmol/L — ABNORMAL LOW (ref 135–145)

## 2023-12-05 LAB — TSH: TSH: 0.343 u[IU]/mL — ABNORMAL LOW (ref 0.350–4.500)

## 2023-12-05 LAB — MAGNESIUM: Magnesium: 2.2 mg/dL (ref 1.7–2.4)

## 2023-12-05 MED ORDER — METOPROLOL SUCCINATE ER 25 MG PO TB24
12.5000 mg | ORAL_TABLET | Freq: Every day | ORAL | Status: DC
  Administered 2023-12-06: 12.5 mg via ORAL
  Filled 2023-12-05: qty 1

## 2023-12-05 MED ORDER — ASPIRIN 81 MG PO TBEC
81.0000 mg | DELAYED_RELEASE_TABLET | Freq: Every day | ORAL | Status: DC
  Administered 2023-12-06: 81 mg via ORAL
  Filled 2023-12-05: qty 1

## 2023-12-05 MED ORDER — METOPROLOL SUCCINATE ER 25 MG PO TB24
25.0000 mg | ORAL_TABLET | Freq: Once | ORAL | Status: AC
  Administered 2023-12-05: 25 mg via ORAL
  Filled 2023-12-05: qty 1

## 2023-12-05 MED ORDER — HYDROCHLOROTHIAZIDE 25 MG PO TABS
25.0000 mg | ORAL_TABLET | Freq: Every day | ORAL | Status: DC
  Administered 2023-12-06: 25 mg via ORAL
  Filled 2023-12-05: qty 1

## 2023-12-05 MED ORDER — POTASSIUM CHLORIDE CRYS ER 20 MEQ PO TBCR
20.0000 meq | EXTENDED_RELEASE_TABLET | Freq: Every day | ORAL | Status: DC
  Administered 2023-12-06: 20 meq via ORAL
  Filled 2023-12-05: qty 1
  Filled 2023-12-05: qty 2

## 2023-12-05 NOTE — Care Management (Signed)
Patient was brought here from Urgent /Care on [employer], patient's car remains in the Parking lot at [facility]. ED RNCM spoke with patient to offer assistance, and to discuss [facility] recommendations. Patient declined, she has lived with husband for 53 years, patient reports husband under comfort care at the [place] [facility] in [place].  Patient states, that she has not been taking the best care due to the circumstances. Patient states she has family here in [place] who is supportive. She also states, that she has just shared the news with other family member who are coming into town this weekend.  Discussed assisting patient with taxi voucher in the am to pick up car. Patient is ED eval still pending.  Updated EDP and Paramedic.

## 2023-12-05 NOTE — ED Triage Notes (Signed)
Pt bib ems from [facility]; called out for palpitations, burning in chest, and belching; palpitation have been ongoing x 2 weeks, worse with BM; 174/110, HR 78, 97% RA, RR 16; 12 lead sinus with PACs

## 2023-12-05 NOTE — ED Provider Triage Note (Signed)
Emergency Medicine Provider Triage Evaluation Note  JENNIFERANN BOOMSMA , a 77 y.o. female hx of afib sp ablation on aspirin was evaluated in triage.  Pt complains of palpitations over the past week after getting a b12 shot. Told to come to ed for eval by urgent care. Also with burning in esophagus and belching.   Review of Systems  Positive:  Negative:   Physical Exam  BP (!) 146/90   Pulse 64   Temp 98.2 F (36.8 C) (Oral)   Resp 18   SpO2 98%  Gen:   Awake, no distress   Resp:  Normal effort  MSK:   Moves extremities without difficulty  Other:    Medical Decision Making  Medically screening exam initiated at 1:57 PM.  Appropriate orders placed.  JANISHA ALMAGER was informed that the remainder of the evaluation will be completed by another provider, this initial triage assessment does not replace that evaluation, and the importance of remaining in the ED until their evaluation is complete.   Rondel Baton, MD 12/05/23 [phone]

## 2023-12-05 NOTE — Progress Notes (Signed)
CSW notified ED provider and patients RN that patient can get a cab voucher at discharge. CSW also reccommended TTS consult if there are psych concerns.

## 2023-12-05 NOTE — ED Provider Notes (Signed)
[facility]ENC[facility] DEPARTMENT AT [facility] Provider Note   CSN: [id number] Arrival date & time: 12/05/23  1318     History {Add pertinent medical, surgical, social history, OB history to HPI:1} Chief Complaint  Patient presents with   Chest Pain   Palpitations    Anita Dickerson is a 77 y.o. female.  She is here with multiple complaints from urgent care.  She said she had some bowel resection done in the past and since then she has had ongoing diarrhea.  This sounds like it has been years.  When she has diarrhea it makes her heart skip a beat and causes her some chest discomfort.  She said she seen a cardiologist and had an ablation for it.  It still seems to come and go.  She is following with doctors up at [facility] and she has some MRIs scheduled.  She also said her husband just had a stroke and he is in hospice now in [place].  She does not feel like she has been eating or drinking very well.  The history is provided by the patient.  Palpitations Palpitations quality:  Fast Onset quality: with bowel movements intermittently. Timing:  Intermittent Progression:  Unchanged Chronicity:  Recurrent Relieved by:  None tried Associated symptoms: chest pain, dizziness and malaise/fatigue   Associated symptoms: no nausea, no shortness of breath and no vomiting   Risk factors: hx of atrial fibrillation and hx of thyroid disease        Home Medications Prior to Admission medications   Medication Sig Start Date End Date Taking? Authorizing Provider  aspirin EC 81 MG tablet Take 81 mg by mouth daily.   Yes [provider]  cyanocobalamin (VITAMIN B12) 1000 MCG/ML injection Inject 1,000 mcg into the muscle every 30 (thirty) days.   Yes [provider]  cyclobenzaprine (FLEXERIL) 5 MG tablet Take 5 mg by mouth at bedtime as needed for muscle spasms. 10/31/23 01/29/24 Yes [provider]  ergocalciferol (VITAMIN D2) 1.25 MG (50000 UT) capsule Take 1  capsule by mouth every 7 (seven) days. 02/27/22  Yes [provider]  esomeprazole (NEXIUM) 40 MG capsule Take 40 mg by mouth daily as needed (heartburn). 10/28/23 10/27/24 Yes [provider]  hydrochlorothiazide (HYDRODIURIL) 25 MG tablet Take 1 tablet by mouth daily. 10/28/23 10/27/24 Yes [provider]  methocarbamol (ROBAXIN) 500 MG tablet Take 1 tablet by mouth 4 (four) times daily. 10/28/23 12/27/23 Yes [provider]  metoprolol succinate (TOPROL-XL) 25 MG 24 hr tablet Take 12.5 mg by mouth daily. 10/28/23 10/27/24 Yes [provider]  Multiple Vitamins-Minerals (ONE DAILY CALCIUM/IRON) TABS Take 1 capsule by mouth daily.   Yes [provider]  potassium chloride (KLOR-CON) 10 MEQ tablet Take 20 mEq by mouth daily. 05/19/23 05/18/24 Yes [provider]      Allergies    Tetanus toxoids, Nicotine, and Diazepam    Review of Systems   Review of Systems  Constitutional:  Positive for malaise/fatigue. Negative for fever.  Respiratory:  Negative for shortness of breath.   Cardiovascular:  Positive for chest pain.  Gastrointestinal:  Positive for diarrhea. Negative for nausea and vomiting.  Neurological:  Positive for dizziness.    Physical Exam Updated Vital Signs BP (!) 164/94   Pulse (!) 33   Temp 97.8 F (36.6 C) (Oral)   Resp 10   SpO2 100%  Physical Exam Vitals and nursing note reviewed.  Constitutional:      General: She  is not in acute distress.    Appearance: Normal appearance. She is well-developed.  HENT:     Head: Normocephalic and atraumatic.  Eyes:     Conjunctiva/sclera: Conjunctivae normal.  Cardiovascular:     Rate and Rhythm: Normal rate. Rhythm irregular.     Heart sounds: Normal heart sounds. No murmur heard. Pulmonary:     Effort: Pulmonary effort is normal. No respiratory distress.     Breath sounds: Normal breath sounds.  Abdominal:     Palpations: Abdomen is soft.     Tenderness: There is  no abdominal tenderness.  Musculoskeletal:        General: No swelling.     Cervical back: Neck supple.     Right lower leg: No tenderness. No edema.     Left lower leg: No tenderness. No edema.  Skin:    General: Skin is warm and dry.     Capillary Refill: Capillary refill takes less than 2 seconds.  Neurological:     General: No focal deficit present.     Mental Status: She is alert.     Sensory: No sensory deficit.     Motor: No weakness.     ED Results / Procedures / Treatments   Labs (all labs ordered are listed, but only abnormal results are displayed) Labs Reviewed  BASIC METABOLIC PANEL - Abnormal; Notable for the following components:      Result Value   Sodium 133 (*)    Chloride 95 (*)    BUN 6 (*)    All other components within normal limits  TSH - Abnormal; Notable for the following components:   TSH 0.343 (*)    All other components within normal limits  CBC  MAGNESIUM  TROPONIN I (HIGH SENSITIVITY)  TROPONIN I (HIGH SENSITIVITY)    EKG EKG Interpretation Date/Time:  Friday December 05 2023 13:27:34 EST Ventricular Rate:  84 PR Interval:    QRS Duration:  86 QT Interval:  386 QTC Calculation: 456 R Axis:   70  Text Interpretation: Atrial fibrillation Minimal voltage criteria for LVH, may be normal variant ( Sokolow-Lyon ) Septal infarct , age undetermined Abnormal ECG Confirmed by Vonita Moss [phone]) on 12/05/2023 1:59:44 PM  Radiology DG Chest 2 View Result Date: 12/05/2023 CLINICAL DATA:  Chest pain. EXAM: CHEST - 2 VIEW COMPARISON:  05/21/2011 FINDINGS: Midline trachea. Mild cardiomegaly. Atherosclerosis in the transverse aorta. No pleural effusion or pneumothorax. Presumed hair artifact projects over the supraclavicular regions and minimally over the apices. No congestive failure. Clear lungs. IMPRESSION: Cardiomegaly without congestive failure. Aortic Atherosclerosis (ICD10-I70.0). Electronically Signed   By: Jeronimo Greaves M.D.   On:  12/05/2023 16:17    Procedures Procedures  {Document cardiac monitor, telemetry assessment procedure when appropriate:1}  Medications Ordered in ED Medications - No data to display  ED Course/ Medical Decision Making/ A&P   {   Click here for ABCD2, HEART and other calculatorsREFRESH Note before signing :1}                              Medical Decision Making Amount and/or Complexity of Data Reviewed Labs: ordered. Radiology: ordered.   This patient complains of ***; this involves an extensive number of treatment Options and is a complaint that carries with it a high risk of complications and morbidity. The differential includes ***  I ordered, reviewed and interpreted labs, which included *** I ordered medication *** and reviewed  PMP when indicated. I ordered imaging studies which included *** and I independently    visualized and interpreted imaging which showed *** Additional history obtained from *** Previous records obtained and reviewed *** I consulted *** and discussed lab and imaging findings and discussed disposition.  Cardiac monitoring reviewed, *** Social determinants considered, *** Critical Interventions: ***  After the interventions stated above, I reevaluated the patient and found *** Admission and further testing considered, ***   {Document critical care time when appropriate:1} {Document review of labs and clinical decision tools ie heart score, Chads2Vasc2 etc:1}  {Document your independent review of radiology images, and any outside records:1} {Document your discussion with family members, caretakers, and with consultants:1} {Document social determinants of health affecting pt's care:1} {Document your decision making why or why not admission, treatments were needed:1} Final Clinical Impression(s) / ED Diagnoses Final diagnoses:  None    Rx / DC Orders ED Discharge Orders     None

## 2023-12-05 NOTE — Discharge Instructions (Addendum)
You were seen in the emergency department for ongoing palpitations along with chronic diarrhea.  You had lab work EKG chest x-ray that did not show an obvious explanation for your symptoms.  You are in atrial fibrillation and it will be important for you to take your medications daily as prescribed by your doctors.

## 2023-12-05 NOTE — ED Notes (Signed)
Pt's behavior was odd and erratic. Pt was talking about how she did not want to get in a cab because she was scared of something happening to her.  Dr made aware. Dr also shared concerns about her behavior.

## 2023-12-06 DIAGNOSIS — F419 Anxiety disorder, unspecified: Secondary | ICD-10-CM | POA: Diagnosis not present

## 2023-12-06 LAB — URINALYSIS, ROUTINE W REFLEX MICROSCOPIC
Bilirubin Urine: NEGATIVE
Glucose, UA: NEGATIVE mg/dL
Hgb urine dipstick: NEGATIVE
Ketones, ur: 5 mg/dL — AB
Leukocytes,Ua: NEGATIVE
Nitrite: NEGATIVE
Protein, ur: NEGATIVE mg/dL
Specific Gravity, Urine: 1.006 (ref 1.005–1.030)
pH: 7 (ref 5.0–8.0)

## 2023-12-06 NOTE — ED Notes (Signed)
Pt informed nursing staff at this time that hospice providing care for her husband just called and informed her that her husband just passed. Dr. Eudelia Bunch updated for AM handoff physician.

## 2023-12-06 NOTE — ED Provider Notes (Signed)
Emergency Medicine Observation Re-evaluation Note  Anita Dickerson is a 77 y.o. female, seen on rounds today.  Pt initially presented to the ED for complaints of Chest Pain and Palpitations Currently, the patient is comfortable, calm. She was seen by [facility] and thereafter by psychiatry this morning has been cleared by all.  Physical Exam  BP (!) 146/93   Pulse 66   Temp 98.2 F (36.8 C) (Oral)   Resp (!) 21   SpO2 100%  Physical Exam General: No acute distress Cardiac: Regular rate Lungs: No respiratory distress Psych: Currently calm  ED Course / MDM  EKG:EKG Interpretation Date/Time:  Friday December 05 2023 19:16:32 EST Ventricular Rate:  113 PR Interval:    QRS Duration:  84 QT Interval:  317 QTC Calculation: 435 R Axis:   66  Text Interpretation: Atrial fibrillation LVH with secondary repolarization abnormality No significant change since last tracing Confirmed by Meridee Score [phone]) on 12/05/2023 7:23:31 PM  I have reviewed the labs performed to date as well as medications administered while in observation.  Recent changes in the last 24 hours include -TOC cleared her yesterday.  Patient was awaiting psych clearance.  Nursing staff informed me that psychiatry has cleared her as well.  She will receive Voucher and discharge patient is comfortable with this plan.  Plan  Current plan is for discharging patient home.    Derwood Kaplan, MD 12/06/23 [phone]

## 2023-12-06 NOTE — ED Notes (Signed)
Pt ao x 4.  Recounting events that lead up to husband's death.  Will give medications when pt is off phone - presently talking with family.

## 2023-12-27 ENCOUNTER — Inpatient Hospital Stay (HOSPITAL_BASED_OUTPATIENT_CLINIC_OR_DEPARTMENT_OTHER)
Admission: EM | Admit: 2023-12-27 | Discharge: 2023-12-29 | DRG: 176 | Disposition: A | Discharge status: Home / Self Care | Payer: Medicare Other | Attending: Internal Medicine | Admitting: Internal Medicine

## 2023-12-27 ENCOUNTER — Encounter (HOSPITAL_BASED_OUTPATIENT_CLINIC_OR_DEPARTMENT_OTHER): Payer: Self-pay

## 2023-12-27 ENCOUNTER — Emergency Department (HOSPITAL_BASED_OUTPATIENT_CLINIC_OR_DEPARTMENT_OTHER): Payer: Medicare Other

## 2023-12-27 ENCOUNTER — Other Ambulatory Visit: Payer: Self-pay

## 2023-12-27 DIAGNOSIS — R000 Tachycardia, unspecified: Secondary | ICD-10-CM | POA: Diagnosis present

## 2023-12-27 DIAGNOSIS — I340 Nonrheumatic mitral (valve) insufficiency: Secondary | ICD-10-CM | POA: Diagnosis present

## 2023-12-27 DIAGNOSIS — F419 Anxiety disorder, unspecified: Secondary | ICD-10-CM | POA: Diagnosis present

## 2023-12-27 DIAGNOSIS — I4892 Unspecified atrial flutter: Secondary | ICD-10-CM | POA: Diagnosis present

## 2023-12-27 DIAGNOSIS — E538 Deficiency of other specified B group vitamins: Secondary | ICD-10-CM | POA: Diagnosis present

## 2023-12-27 DIAGNOSIS — Z6821 Body mass index (BMI) 21.0-21.9, adult: Secondary | ICD-10-CM | POA: Diagnosis not present

## 2023-12-27 DIAGNOSIS — K5900 Constipation, unspecified: Secondary | ICD-10-CM | POA: Diagnosis present

## 2023-12-27 DIAGNOSIS — E876 Hypokalemia: Secondary | ICD-10-CM | POA: Diagnosis present

## 2023-12-27 DIAGNOSIS — E440 Moderate protein-calorie malnutrition: Secondary | ICD-10-CM | POA: Diagnosis present

## 2023-12-27 DIAGNOSIS — I2699 Other pulmonary embolism without acute cor pulmonale: Principal | ICD-10-CM | POA: Diagnosis present

## 2023-12-27 DIAGNOSIS — R0602 Shortness of breath: Secondary | ICD-10-CM | POA: Diagnosis not present

## 2023-12-27 DIAGNOSIS — Z91199 Patient's noncompliance with other medical treatment and regimen due to unspecified reason: Secondary | ICD-10-CM

## 2023-12-27 DIAGNOSIS — Z833 Family history of diabetes mellitus: Secondary | ICD-10-CM | POA: Diagnosis not present

## 2023-12-27 DIAGNOSIS — I4891 Unspecified atrial fibrillation: Secondary | ICD-10-CM | POA: Diagnosis present

## 2023-12-27 DIAGNOSIS — K219 Gastro-esophageal reflux disease without esophagitis: Secondary | ICD-10-CM | POA: Diagnosis present

## 2023-12-27 DIAGNOSIS — Z853 Personal history of malignant neoplasm of breast: Secondary | ICD-10-CM

## 2023-12-27 DIAGNOSIS — I10 Essential (primary) hypertension: Secondary | ICD-10-CM | POA: Diagnosis present

## 2023-12-27 DIAGNOSIS — I480 Paroxysmal atrial fibrillation: Secondary | ICD-10-CM | POA: Diagnosis present

## 2023-12-27 DIAGNOSIS — D649 Anemia, unspecified: Secondary | ICD-10-CM | POA: Diagnosis present

## 2023-12-27 DIAGNOSIS — Z888 Allergy status to other drugs, medicaments and biological substances status: Secondary | ICD-10-CM

## 2023-12-27 DIAGNOSIS — R21 Rash and other nonspecific skin eruption: Secondary | ICD-10-CM | POA: Diagnosis present

## 2023-12-27 DIAGNOSIS — Z9049 Acquired absence of other specified parts of digestive tract: Secondary | ICD-10-CM

## 2023-12-27 DIAGNOSIS — F4321 Adjustment disorder with depressed mood: Secondary | ICD-10-CM | POA: Diagnosis present

## 2023-12-27 DIAGNOSIS — Z7982 Long term (current) use of aspirin: Secondary | ICD-10-CM

## 2023-12-27 DIAGNOSIS — R197 Diarrhea, unspecified: Secondary | ICD-10-CM | POA: Diagnosis present

## 2023-12-27 DIAGNOSIS — Z86711 Personal history of pulmonary embolism: Secondary | ICD-10-CM | POA: Diagnosis not present

## 2023-12-27 DIAGNOSIS — Z79899 Other long term (current) drug therapy: Secondary | ICD-10-CM

## 2023-12-27 DIAGNOSIS — Z841 Family history of disorders of kidney and ureter: Secondary | ICD-10-CM | POA: Diagnosis not present

## 2023-12-27 DIAGNOSIS — I2693 Single subsegmental pulmonary embolism without acute cor pulmonale: Secondary | ICD-10-CM | POA: Diagnosis present

## 2023-12-27 DIAGNOSIS — R5381 Other malaise: Secondary | ICD-10-CM | POA: Diagnosis present

## 2023-12-27 DIAGNOSIS — I272 Pulmonary hypertension, unspecified: Secondary | ICD-10-CM | POA: Diagnosis present

## 2023-12-27 DIAGNOSIS — Z634 Disappearance and death of family member: Secondary | ICD-10-CM | POA: Diagnosis not present

## 2023-12-27 DIAGNOSIS — R0600 Dyspnea, unspecified: Secondary | ICD-10-CM

## 2023-12-27 DIAGNOSIS — Z8249 Family history of ischemic heart disease and other diseases of the circulatory system: Secondary | ICD-10-CM

## 2023-12-27 DIAGNOSIS — R259 Unspecified abnormal involuntary movements: Secondary | ICD-10-CM | POA: Diagnosis present

## 2023-12-27 DIAGNOSIS — I4510 Unspecified right bundle-branch block: Secondary | ICD-10-CM | POA: Diagnosis present

## 2023-12-27 DIAGNOSIS — Z7901 Long term (current) use of anticoagulants: Secondary | ICD-10-CM

## 2023-12-27 LAB — RESP PANEL BY RT-PCR (RSV, FLU A&B, COVID)  RVPGX2
Influenza A by PCR: NEGATIVE
Influenza B by PCR: NEGATIVE
Resp Syncytial Virus by PCR: NEGATIVE
SARS Coronavirus 2 by RT PCR: NEGATIVE

## 2023-12-27 LAB — CBC
HCT: 34.4 % — ABNORMAL LOW (ref 36.0–46.0)
HCT: 35.4 % — ABNORMAL LOW (ref 36.0–46.0)
Hemoglobin: 11.1 g/dL — ABNORMAL LOW (ref 12.0–15.0)
Hemoglobin: 12 g/dL (ref 12.0–15.0)
MCH: 30.2 pg (ref 26.0–34.0)
MCH: 30.3 pg (ref 26.0–34.0)
MCHC: 32.3 g/dL (ref 30.0–36.0)
MCHC: 33.9 g/dL (ref 30.0–36.0)
MCV: 89.4 fL (ref 80.0–100.0)
MCV: 93.5 fL (ref 80.0–100.0)
Platelets: 215 10*3/uL (ref 150–400)
Platelets: 218 10*3/uL (ref 150–400)
RBC: 3.68 MIL/uL — ABNORMAL LOW (ref 3.87–5.11)
RBC: 3.96 MIL/uL (ref 3.87–5.11)
RDW: 14.6 % (ref 11.5–15.5)
RDW: 14.6 % (ref 11.5–15.5)
WBC: 5.3 10*3/uL (ref 4.0–10.5)
WBC: 6.2 10*3/uL (ref 4.0–10.5)
nRBC: 0 % (ref 0.0–0.2)
nRBC: 0 % (ref 0.0–0.2)

## 2023-12-27 LAB — BASIC METABOLIC PANEL
Anion gap: 10 (ref 5–15)
BUN: 8 mg/dL (ref 8–23)
CO2: 30 mmol/L (ref 22–32)
Calcium: 9.2 mg/dL (ref 8.9–10.3)
Chloride: 100 mmol/L (ref 98–111)
Creatinine, Ser: 0.98 mg/dL (ref 0.44–1.00)
GFR, Estimated: 59 mL/min — ABNORMAL LOW (ref >60)
Glucose, Bld: 92 mg/dL (ref 70–99)
Potassium: 3.6 mmol/L (ref 3.5–5.1)
Sodium: 140 mmol/L (ref 135–145)

## 2023-12-27 LAB — TROPONIN I (HIGH SENSITIVITY)
Troponin I (High Sensitivity): 7 ng/L (ref <18)
Troponin I (High Sensitivity): 7 ng/L (ref <18)

## 2023-12-27 LAB — TSH: TSH: 0.623 u[IU]/mL (ref 0.350–4.500)

## 2023-12-27 LAB — BRAIN NATRIURETIC PEPTIDE: B Natriuretic Peptide: 219.3 pg/mL — ABNORMAL HIGH (ref 0.0–100.0)

## 2023-12-27 MED ORDER — ORAL CARE MOUTH RINSE: 15.0000 mL | OROMUCOSAL | Status: DC | PRN

## 2023-12-27 MED ORDER — HEPARIN BOLUS VIA INFUSION
4000.0000 [IU] | Freq: Once | INTRAVENOUS | Status: AC
  Administered 2023-12-27: 4000 [IU] via INTRAVENOUS

## 2023-12-27 MED ORDER — ACETAMINOPHEN 325 MG PO TABS
650.0000 mg | ORAL_TABLET | Freq: Four times a day (QID) | ORAL | Status: DC | PRN
  Administered 2023-12-28: 650 mg via ORAL
  Filled 2023-12-27: qty 2

## 2023-12-27 MED ORDER — HEPARIN (PORCINE) 25000 UT/250ML-% IV SOLN
850.0000 [IU]/h | INTRAVENOUS | Status: DC
  Administered 2023-12-27: 950 [IU]/h via INTRAVENOUS
  Administered 2023-12-28: 850 [IU]/h via INTRAVENOUS
  Filled 2023-12-27 (×2): qty 250

## 2023-12-27 MED ORDER — SODIUM CHLORIDE 0.9 % IV BOLUS
1000.0000 mL | Freq: Once | INTRAVENOUS | Status: AC
Start: 2023-12-27 — End: 2023-12-27
  Administered 2023-12-27: 1000 mL via INTRAVENOUS

## 2023-12-27 MED ORDER — IOHEXOL 350 MG/ML SOLN
100.0000 mL | Freq: Once | INTRAVENOUS | Status: AC | PRN
  Administered 2023-12-27: 75 mL via INTRAVENOUS

## 2023-12-27 MED ORDER — ACETAMINOPHEN 650 MG RE SUPP: 650.0000 mg | Freq: Four times a day (QID) | RECTAL | Status: DC | PRN

## 2023-12-27 MED ORDER — METOPROLOL SUCCINATE ER 25 MG PO TB24
25.0000 mg | ORAL_TABLET | Freq: Every day | ORAL | Status: DC
  Administered 2023-12-27: 25 mg via ORAL
  Filled 2023-12-27: qty 1

## 2023-12-27 MED ORDER — SENNOSIDES-DOCUSATE SODIUM 8.6-50 MG PO TABS: 1.0000 | ORAL_TABLET | Freq: Every evening | ORAL | Status: DC | PRN

## 2023-12-27 MED ORDER — ONDANSETRON HCL 4 MG PO TABS: 4.0000 mg | ORAL_TABLET | Freq: Four times a day (QID) | ORAL | Status: DC | PRN

## 2023-12-27 MED ORDER — SODIUM CHLORIDE 0.9 % IV BOLUS
1000.0000 mL | Freq: Once | INTRAVENOUS | Status: AC
  Administered 2023-12-27: 1000 mL via INTRAVENOUS

## 2023-12-27 MED ORDER — ONDANSETRON HCL 4 MG/2ML IJ SOLN: 4.0000 mg | Freq: Four times a day (QID) | INTRAMUSCULAR | Status: DC | PRN

## 2023-12-27 NOTE — ED Provider Notes (Signed)
 [place] [facility] AT [facility] Provider Note   CSN: [id number] Arrival date & time: 12/27/23  1142     History  Chief Complaint  Patient presents with   Shortness of Breath    Anita Dickerson is a 78 y.o. female.   Shortness of Breath   78 year old female presents emergency department complaints of shortness of breath, palpitations.  Patient reports feeling symptoms for the past 3 days.  States she has been noncompliant with her at home medications.  Has history of atrial fibrillation not on anticoagulation due to intolerance of Eliquis .  She is supposed to be rate controlled on metoprolol  but has not been taking said medication.  Denies any chest pain, fever, cough, abdominal pain, nausea vomiting, urinary symptoms, change in bowel habits.  Patient reports having dental implants that has titanium screws in them 20+ years ago and feels as if she has intermittent allergic reactions and feels like this is the case currently.  Past medical history significant for atrial fibrillation, hypertension, vitamin B12 deficiency  Home Medications Prior to Admission medications   Medication Sig Start Date End Date Taking? Authorizing Provider  Ascorbic Acid (VITAMIN C PO) Take 1 tablet by mouth daily.   Yes [provider]  aspirin  EC 81 MG tablet Take 81-162 mg by mouth daily.   Yes [provider]  ergocalciferol  (VITAMIN D2) 1.25 MG (50000 UT) capsule Take 1 capsule by mouth every 7 (seven) days. 02/27/22  Yes [provider]  hydrochlorothiazide  (HYDRODIURIL ) 25 MG tablet Take 1 tablet by mouth daily. 10/28/23 10/27/24 Yes [provider]  MAGNESIUM PO Take 1 tablet by mouth daily.   Yes [provider]  metoprolol  succinate (TOPROL -XL) 25 MG 24 hr tablet Take 12.5 mg by mouth in the morning. 10/28/23 10/27/24 Yes [provider]  Multiple Vitamins-Minerals (ONE DAILY CALCIUM/IRON) TABS Take 1 capsule by mouth  daily.   Yes [provider]  potassium chloride  (KLOR-CON ) 10 MEQ tablet Take 20 mEq by mouth daily. 05/19/23 05/18/24 Yes [provider]  cyanocobalamin  (VITAMIN B12) 1000 MCG/ML injection Inject 1,000 mcg into the muscle every 30 (thirty) days. Patient not taking: Reported on 12/27/2023    [provider]  cyclobenzaprine (FLEXERIL) 5 MG tablet Take 5 mg by mouth at bedtime as needed for muscle spasms. Patient not taking: Reported on 12/27/2023 10/31/23 01/29/24  [provider]  esomeprazole (NEXIUM) 40 MG capsule Take 40 mg by mouth daily as needed (heartburn). Patient not taking: Reported on 12/27/2023 10/28/23 10/27/24  [provider]  ondansetron  (ZOFRAN -ODT) 4 MG disintegrating tablet Take 4 mg by mouth 3 (three) times daily as needed for vomiting or nausea. Patient not taking: Reported on 12/27/2023 12/18/23   [provider]      Allergies    Tetanus toxoids, Nicotine, and Diazepam    Review of Systems   Review of Systems  Respiratory:  Positive for shortness of breath.   All other systems reviewed and are negative.   Physical Exam Updated Vital Signs BP 118/68 (BP Location: Right Arm)   Pulse 84   Temp 98.4 F (36.9 C) (Oral)   Resp 18   Ht 5' 7 (1.702 m)   Wt 61.6 kg   SpO2 100%   BMI 21.27 kg/m  Physical Exam Vitals and nursing note reviewed.  Constitutional:      General: She is not in acute distress.    Appearance: She is well-developed.  HENT:  Head: Normocephalic and atraumatic.  Eyes:     Conjunctiva/sclera: Conjunctivae normal.  Cardiovascular:     Rate and Rhythm: Tachycardia present. Rhythm irregular.     Pulses: Normal pulses.  Pulmonary:     Effort: Pulmonary effort is normal. No respiratory distress.     Breath sounds: Normal breath sounds. No wheezing or rales.  Abdominal:     Palpations: Abdomen is soft.     Tenderness: There is no abdominal tenderness.  Musculoskeletal:        General: No  swelling.     Cervical back: Neck supple.  Skin:    General: Skin is warm and dry.     Capillary Refill: Capillary refill takes less than 2 seconds.  Neurological:     Mental Status: She is alert.  Psychiatric:        Mood and Affect: Mood normal.     ED Results / Procedures / Treatments   Labs (all labs ordered are listed, but only abnormal results are displayed) Labs Reviewed  BASIC METABOLIC PANEL - Abnormal; Notable for the following components:      Result Value   GFR, Estimated 59 (*)    All other components within normal limits  CBC - Abnormal; Notable for the following components:   HCT 35.4 (*)    All other components within normal limits  BRAIN NATRIURETIC PEPTIDE - Abnormal; Notable for the following components:   B Natriuretic Peptide 219.3 (*)    All other components within normal limits  BASIC METABOLIC PANEL - Abnormal; Notable for the following components:   Potassium 3.3 (*)    Glucose, Bld 103 (*)    Calcium 8.2 (*)    All other components within normal limits  CBC - Abnormal; Notable for the following components:   RBC 3.68 (*)    Hemoglobin 11.1 (*)    HCT 34.4 (*)    All other components within normal limits  HEPARIN  LEVEL (UNFRACTIONATED) - Abnormal; Notable for the following components:   Heparin  Unfractionated 0.83 (*)    All other components within normal limits  RESP PANEL BY RT-PCR (RSV, FLU A&B, COVID)  RVPGX2  CULTURE, BLOOD (ROUTINE X 2)  CULTURE, BLOOD (ROUTINE X 2)  TSH  TSH  HEPARIN  LEVEL (UNFRACTIONATED)  HEPARIN  LEVEL (UNFRACTIONATED)  URINALYSIS, ROUTINE W REFLEX MICROSCOPIC  OCCULT BLOOD X 1 CARD TO LAB, STOOL  TROPONIN I (HIGH SENSITIVITY)  TROPONIN I (HIGH SENSITIVITY)    EKG EKG Interpretation Date/Time:  Saturday December 27 2023 13:18:11 EST Ventricular Rate:  140 PR Interval:  194 QRS Duration:  112 QT Interval:  333 QTC Calculation: 509 R Axis:   62  Text Interpretation: Sinus tachycardia Incomplete right bundle  branch block Probable anteroseptal infarct, old Nonspecific T abnormalities, lateral leads ST elevation, consider inferior injury Prolonged QT interval duplicate Confirmed by Dean Clarity [phone]) on 12/28/2023 9:15:50 AM  Radiology CT Angio Chest PE W and/or Wo Contrast Result Date: 12/27/2023 CLINICAL DATA:  Shortness of breath. High probability pulmonary embolism. EXAM: CT ANGIOGRAPHY CHEST WITH CONTRAST TECHNIQUE: Multidetector CT imaging of the chest was performed using the standard protocol during bolus administration of intravenous contrast. Multiplanar CT image reconstructions and MIPs were obtained to evaluate the vascular anatomy. RADIATION DOSE REDUCTION: This exam was performed according to the departmental dose-optimization program which includes automated exposure control, adjustment of the mA and/or kV according to patient size and/or use of iterative reconstruction technique. CONTRAST:  75mL OMNIPAQUE  IOHEXOL  350 MG/ML SOLN COMPARISON:  None  Available. FINDINGS: Cardiovascular: Single site of pulmonary embolism is seen in a subsegmental posterior right lower lobe pulmonary artery branch (e.g. Image 276/5). No other sites of pulmonary embolism identified. Thoracic aorta is not well opacified by contrast, however, there is no evidence of thoracic aortic aneurysm. Mediastinum/Nodes: No masses or pathologically enlarged lymph nodes identified. Lungs/Pleura: No pulmonary mass, infiltrate, or effusion. Upper abdomen: No acute findings. Musculoskeletal: No suspicious bone lesions identified. Review of the MIP images confirms the above findings. IMPRESSION: Single site of pulmonary embolism in a posterior subsegmental branch of the right lower lobe pulmonary artery. Critical Value/emergent results were called by telephone at the time of interpretation on 12/27/2023 at 1:55 pm to provider Brylee Mcgreal , who verbally acknowledged these results. Electronically Signed   By: Norleen DELENA Kil M.D.   On:  12/27/2023 14:06   DG Chest Port 1 View Result Date: 12/27/2023 CLINICAL DATA:  Short of breath EXAM: PORTABLE CHEST 1 VIEW COMPARISON:  12/05/2023 FINDINGS: Normal mediastinum and cardiac silhouette. Normal pulmonary vasculature. No evidence of effusion, infiltrate, or pneumothorax. No acute bony abnormality. IMPRESSION: No active disease. Electronically Signed   By: Jackquline Boxer M.D.   On: 12/27/2023 12:37    Procedures .Critical Care  Performed by: Silver Wonda DELENA, PA Authorized by: Silver Wonda DELENA, PA   Critical care provider statement:    Critical care time (minutes):  46   Critical care was necessary to treat or prevent imminent or life-threatening deterioration of the following conditions:  Respiratory failure   Critical care was time spent personally by me on the following activities:  Development of treatment plan with patient or surrogate, discussions with consultants, evaluation of patient's response to treatment, examination of patient, ordering and review of laboratory studies, ordering and review of radiographic studies, ordering and performing treatments and interventions, pulse oximetry, re-evaluation of patient's condition and review of old charts   I assumed direction of critical care for this patient from another provider in my specialty: no     Care discussed with: admitting provider       Medications Ordered in ED Medications  heparin  ADULT infusion 100 units/mL (25000 units/250mL) (950 Units/hr Intravenous New Bag/Given 12/27/23 1527)  Oral care mouth rinse (has no administration in time range)  acetaminophen  (TYLENOL ) tablet 650 mg (has no administration in time range)    Or  acetaminophen  (TYLENOL ) suppository 650 mg (has no administration in time range)  ondansetron  (ZOFRAN ) tablet 4 mg (has no administration in time range)    Or  ondansetron  (ZOFRAN ) injection 4 mg (has no administration in time range)  senna-docusate (Senokot-S) tablet 1 tablet (has no  administration in time range)  metoprolol  succinate (TOPROL -XL) 24 hr tablet 12.5 mg (has no administration in time range)  melatonin tablet 3 mg (has no administration in time range)  sodium chloride  0.9 % bolus 1,000 mL (0 mLs Intravenous Stopped 12/27/23 1633)  iohexol  (OMNIPAQUE ) 350 MG/ML injection 100 mL (75 mLs Intravenous Contrast Given 12/27/23 1333)  heparin  bolus via infusion 4,000 Units (4,000 Units Intravenous Bolus from Bag 12/27/23 1531)  sodium chloride  0.9 % bolus 1,000 mL (0 mLs Intravenous Stopped 12/27/23 1829)  Glycerin  (Adult) 2 g suppository 1 suppository (1 suppository Rectal Given 12/28/23 0334)    ED Course/ Medical Decision Making/ A&P Clinical Course as of 12/28/23 0940  Sat Dec 27, 2023  1339 EKG 12-Lead Repeat EKG after metoprolol  was administered showed more sinus tachycardia.  Will administer normal saline fluids as well as obtain CT  PE study for potential PE as cause of patient's shortness of breath as well as palpitations. [CR]  1742 Consulted Dr. Lorelie of Hospitalist who agreed with admission and assume further treatment/care. [CR]    Clinical Course User Index [CR] Silver Wonda LABOR, PA                                 Medical Decision Making Amount and/or Complexity of Data Reviewed Labs: ordered. Radiology: ordered. ECG/medicine tests:  Decision-making details documented in ED Course.  Risk Prescription drug management. Decision regarding hospitalization.   This patient presents to the ED for concern of shortness of breath, this involves an extensive number of treatment options, and is a complaint that carries with it a high risk of complications and morbidity.  The differential diagnosis includes acs, PE, pneumothorax, pneumonia, COPD/asthma, anemia, chf, other   Co morbidities that complicate the patient evaluation  See HPI   Additional history obtained:  Additional history obtained from EMR External records from outside source obtained  and reviewed including hospital records   Lab Tests:  I Ordered, and personally interpreted labs.  The pertinent results include: No leukocytosis.  No evidence of anemia.  Platelets within range.  No Electra abnormalities.  No renal dysfunction for baseline.  Viral panel negative.  TSH within normal limits.  BNP slightly elevated to 19.3.  Troponin of 7 with repeat 7   Imaging Studies ordered:  I ordered imaging studies including chest x-ray, CT angio chest PE I independently visualized and interpreted imaging which showed  Chest x-ray: No acute cardiopulmonary maladies CT angio chest PE: Posterior right subsegmental branch PE I agree with the radiologist interpretation   Cardiac Monitoring: / EKG:  The patient was maintained on a cardiac monitor.  I personally viewed and interpreted the cardiac monitored which showed an underlying rhythm of: Initial A-fib with RVR with a rate of 120; patient converted after metoprolol  was given to sinus rhythm but with rate in the 130s   Consultations Obtained:  See ED course  Problem List / ED Course / Critical interventions / Medication management  PE, shortness of breath, palpitations I ordered medication including Motrin, heparin , normal saline   Reevaluation of the patient after these medicines showed that the patient improved I have reviewed the patients home medicines and have made adjustments as needed   Social Determinants of Health:  Denies tobacco, illicit drug use   Test / Admission - Considered:  PE, shortness of breath, palpitations Vitals signs significant for tachycardia with heart rate in the 130s, tachypnea respirate in the 20s, hypertension blood pressure 150 systolic. Otherwise within normal range and stable throughout visit. Laboratory/imaging studies significant for: See above 78 year old female presents emergency department complaints of shortness of breath, palpitations for the past few days.  Patient with history  of atrial fibrillation and is noncompliant with her at home metoprolol .  Initially on exam, patient with atrial fibrillation RVR with rate of 120.  Lungs clear to auscultation bilaterally.  Labs reassuring.  Delta negative troponin, EKG initially concerning for atrial fibrillation with RVR with rate 120; low suspicion for ACS.  Patient initially attempted to manage heart rate with continuation of patient's home metoprolol  given that she was not very compliant with her at home medicines.  Repeat EKG showed conversion to sinus rhythm but now sinus tachycardia with a rate in 130s/140s.  Chest x-ray without obvious acute cardiopulmonary abnormality.  Given patient's  persistent tachycardia as well as describe symptoms of shortness of breath, PCP obtained for assessment of PE.  Small right-sided PE visualized without evidence of right-sided heart strain.  Suspect patient symptoms likely secondary to PE.  Treated with a liter of IV fluids and did not note improvement of tachycardia; patient still in the 130s/140s.  Patient heparinized.  UA obtained as well as blood cultures for assessment of potential infectious etiology as cause of tachycardia.  Will admit to hospitalist given patient's persistent tachycardia in the setting of small right sided PE.  Worrisome signs and symptoms were discussed with the patient, and the patient acknowledged understanding to return to the ED if noticed. Patient was stable upon discharge.          Final Clinical Impression(s) / ED Diagnoses Final diagnoses:  Other acute pulmonary embolism without acute cor pulmonale (HCC)  Dyspnea, unspecified type  Sinus tachycardia    Rx / DC Orders ED Discharge Orders     None         Silver Wonda LABOR, [place] 12/28/23 [id number]    Geraldene Hamilton, MD 01/14/24 2332

## 2023-12-27 NOTE — ED Notes (Signed)
 Blood cultures obtain

## 2023-12-27 NOTE — H&P (Signed)
 History and Physical    Patient: Anita Dickerson FMW:[id number] DOB: 25-Sep-1946 DOA: 12/27/2023 DOS: the patient was seen and examined on 12/27/2023 PCP: Franchot Charlies GRADE, MD  Patient coming from: Home  Chief Complaint:  Chief Complaint  Patient presents with   Shortness of Breath   HPI: Anita Dickerson is a 78 y.o. female with medical history significant atrial fibrillation with history of ablation, Paget's disease, and small bowel resection due to obstruction in the distant past. The patient said she took Eliquis  until she cut her hand and bled really badly.  After that she stopped it.  She has been off the Eliquis  for at least 4 years.  She is no longer taking her metoprolol  either.  She presents today because she got very sick.  She describes her throat closing up and feeling like she had a lot of mucus.  She also had palpitations and noticed that her heart rate was 140.  She ended up calling EMS.  She thought she was having an allergic reaction.  She feels she is allergic to the dental implant screws that are in her mouth.  She has been very stressed out trying to get those removed.  She has found a dentist to do it but now is waiting on cardiac clearance.  She has had significant weight loss and has severe difficulty chewing and has lots of mouth discomfort because of those screws.  Her husband died recently and she has been grieving that.  She also has chronic trouble with diarrhea and sometimes constipation ever since she had her small bowel resection surgery.  She also reported that she felt short of breath today though she did not have any chest pain.  In the emergency department the patient had a CTA and was found to have an acute PE.  She was also noted to be in A-fib with RVR.  She received IV beta-blocker and she converted back to normal sinus rhythm.  She was started on IV heparin  for the PE and the hospitalists are asked to admit.  The patient is not requiring any oxygen and is not  reporting any shortness of breath at this time.       Review of Systems: As mentioned in the history of present illness. All other systems reviewed and are negative. 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 She has never used smokeless tobacco. She reports that she does not drink alcohol and does not use drugs.  Allergies  Allergen Reactions   Tetanus Toxoids Shortness Of Breath and Rash   Nicotine Itching    Reaction Type: Allergy   Diazepam Other (See Comments) and Rash    Reaction Type: Allergy; Reaction(s): Visual hallucinations,unknown    Family History  Problem Relation Age of Onset   Heart disease Mother    Diabetes Father    Kidney failure Father    Diabetes Sister    Hypertension Sister  Heart disease Sister     Prior to Admission medications   Medication Sig Start Date End Date Taking? Authorizing Provider  Ascorbic Acid (VITAMIN C PO) Take 1 tablet by mouth daily.   Yes [provider]  aspirin  EC 81 MG tablet Take 81-162 mg by mouth daily.   Yes [provider]  ergocalciferol  (VITAMIN D2) 1.25 MG (50000 UT) capsule Take 1 capsule by mouth every 7 (seven) days. 02/27/22  Yes [provider]  hydrochlorothiazide  (HYDRODIURIL ) 25 MG tablet Take 1 tablet by mouth daily. 10/28/23 10/27/24 Yes [provider]  MAGNESIUM PO Take 1 tablet by mouth daily.   Yes [provider]  metoprolol  succinate (TOPROL -XL) 25 MG 24 hr tablet Take 12.5 mg by mouth in the morning. 10/28/23 10/27/24 Yes  [provider]  Multiple Vitamins-Minerals (ONE DAILY CALCIUM/IRON) TABS Take 1 capsule by mouth daily.   Yes [provider]  potassium chloride  (KLOR-CON ) 10 MEQ tablet Take 20 mEq by mouth daily. 05/19/23 05/18/24 Yes [provider]  cyanocobalamin  (VITAMIN B12) 1000 MCG/ML injection Inject 1,000 mcg into the muscle every 30 (thirty) days. Patient not taking: Reported on 12/27/2023    [provider]  cyclobenzaprine (FLEXERIL) 5 MG tablet Take 5 mg by mouth at bedtime as needed for muscle spasms. Patient not taking: Reported on 12/27/2023 10/31/23 01/29/24  [provider]  esomeprazole (NEXIUM) 40 MG capsule Take 40 mg by mouth daily as needed (heartburn). Patient not taking: Reported on 12/27/2023 10/28/23 10/27/24  [provider]  ondansetron  (ZOFRAN -ODT) 4 MG disintegrating tablet Take 4 mg by mouth 3 (three) times daily as needed for vomiting or nausea. Patient not taking: Reported on 12/27/2023 12/18/23   [provider]    Physical Exam: Vitals:   12/27/23 1600 12/27/23 1639 12/27/23 1830 12/27/23 2049  BP:   (!) 149/102 (!) 143/96  Pulse:  (!) 139 97 98  Resp:   20 20  Temp: 98 F (36.7 C) 98.3 F (36.8 C) 98.2 F (36.8 C) 98.3 F (36.8 C)  TempSrc: Oral Oral Oral Oral  SpO2:  100% 100% 99%  Weight:   61.6 kg   Height:   5' 7 (1.702 m)    Physical Exam:  General: No acute distress, thin HEENT: Normocephalic, atraumatic, PERRL. Did not evaluate her oral pharynx and dental implant screws.  Cardiovascular: Normal rate and rhythm. Distal pulses intact. Pulmonary: Normal pulmonary effort, normal breath sounds Gastrointestinal: Nondistended abdomen, soft, non-tender, normoactive bowel sounds Musculoskeletal:Normal ROM, no lower ext edema Skin: Skin is warm and dry. Neuro: No focal deficits noted, AAOx3. PSYCH: Attentive and cooperative  Data Reviewed:  Results for orders placed or performed during the hospital  encounter of 12/27/23 (from the past 24 hours)  Basic metabolic panel     Status: Abnormal   Collection Time: 12/27/23 12:09 PM  Result Value Ref Range   Sodium 140 135 - 145 mmol/L   Potassium 3.6 3.5 - 5.1 mmol/L   Chloride 100 98 - 111 mmol/L   CO2 30 22 - 32 mmol/L   Glucose, Bld 92 70 - 99 mg/dL   BUN 8 8 - 23 mg/dL   Creatinine, Ser 9.01 0.44 - 1.00 mg/dL   Calcium 9.2 8.9 - 89.6 mg/dL   GFR, Estimated 59 (L) >60 mL/min   Anion gap 10 5 - 15  CBC     Status: Abnormal   Collection Time: 12/27/23 12:09 PM  Result Value Ref Range   WBC 5.3  4.0 - 10.5 K/uL   RBC 3.96 3.87 - 5.11 MIL/uL   Hemoglobin 12.0 12.0 - 15.0 g/dL   HCT 64.5 (L) 63.9 - 53.9 %   MCV 89.4 80.0 - 100.0 fL   MCH 30.3 26.0 - 34.0 pg   MCHC 33.9 30.0 - 36.0 g/dL   RDW 85.3 88.4 - 84.4 %   Platelets 218 150 - 400 K/uL   nRBC 0.0 0.0 - 0.2 %  TSH     Status: None   Collection Time: 12/27/23 12:09 PM  Result Value Ref Range   TSH 0.623 0.350 - 4.500 uIU/mL  Troponin I (High Sensitivity)     Status: None   Collection Time: 12/27/23 12:09 PM  Result Value Ref Range   Troponin I (High Sensitivity) 7 <18 ng/L  Brain natriuretic peptide     Status: Abnormal   Collection Time: 12/27/23 12:09 PM  Result Value Ref Range   B Natriuretic Peptide 219.3 (H) 0.0 - 100.0 pg/mL  Resp panel by RT-PCR (RSV, Flu A&B, Covid) Anterior Nasal Swab     Status: None   Collection Time: 12/27/23 12:13 PM   Specimen: Anterior Nasal Swab  Result Value Ref Range   SARS Coronavirus 2 by RT PCR NEGATIVE NEGATIVE   Influenza A by PCR NEGATIVE NEGATIVE   Influenza B by PCR NEGATIVE NEGATIVE   Resp Syncytial Virus by PCR NEGATIVE NEGATIVE  Troponin I (High Sensitivity)     Status: None   Collection Time: 12/27/23  1:57 PM  Result Value Ref Range   Troponin I (High Sensitivity) 7 <18 ng/L     Assessment and Plan: Acute PE - currently in IV Heparin .  Transition to DOAC. She is not requiring oxygen. She should have complete cancer  screening as an outpatient.  - Will order stool guaiac.  2. P Afib w RVR - Now back in NSR - resume Metoprolol  and DOAC.  3. Abnormal weight loss/ moderate protein calorie malnutrition - due to poor intake because of dental implant hardware. She has an transport planner and is working on getting the hardware removed.    4. Grief over the recent loss of her husband - Her primary doctor has made a referral to a therapist.     Advance Care Planning:   Code Status: Full Code the patient names her sister, Magdalene as her surrogate decision-maker.  CODE STATUS was not discussed so the patient will be full code by default.  The code status discussion was deferred because the patient was anxious and tearful.   Consults: none  Family Communication: none  Severity of Illness: The appropriate patient status for this patient is INPATIENT. Inpatient status is judged to be reasonable and necessary in order to provide the required intensity of service to ensure the patient's safety. The patient's presenting symptoms, physical exam findings, and initial radiographic and laboratory data in the context of their chronic comorbidities is felt to place them at high risk for further clinical deterioration. Furthermore, it is not anticipated that the patient will be medically stable for discharge from the hospital within 2 midnights of admission.   * I certify that at the point of admission it is my clinical judgment that the patient will require inpatient hospital care spanning beyond 2 midnights from the point of admission due to high intensity of service, high risk for further deterioration and high frequency of surveillance required.*  Author: ARTHEA CHILD, MD 12/27/2023 10:39 PM  For on call review [email or web address].

## 2023-12-27 NOTE — Progress Notes (Signed)
 PHARMACY - ANTICOAGULATION CONSULT NOTE  Pharmacy Consult for heparin  Indication: pulmonary embolus  Allergies  Allergen Reactions   Tetanus Toxoids Shortness Of Breath and Rash   Nicotine Itching    Reaction Type: Allergy   Diazepam Other (See Comments) and Rash    Reaction Type: Allergy; Reaction(s): Visual hallucinations,unknown    Patient Measurements:   Heparin  Dosing Weight: TBW  Vital Signs: Temp: 97.9 F (36.6 C) (01/11 1143) Temp Source: Oral (01/11 1143) BP: 156/119 (01/11 1430) Pulse Rate: 133 (01/11 1430)  Labs: Recent Labs    12/27/23 1209 12/27/23 1357  HGB 12.0  --   HCT 35.4*  --   PLT 218  --   CREATININE 0.98  --   TROPONINIHS 7 7    CrCl cannot be calculated (Unknown ideal weight.).   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

## 2023-12-27 NOTE — ED Notes (Signed)
 Called [facility] at [facility] for transport 17:15

## 2023-12-27 NOTE — ED Triage Notes (Signed)
 Pt present via ems with complaint of sob. Pt states that she believes that she is having allergic reaction to dental implants that were implanted 20 years ago.

## 2023-12-27 NOTE — Plan of Care (Signed)
 [facility]  Referring EDP: Wonda Simpers, PA  Anita Dickerson DOB 09/07/1946  The patient is a 77 year old female with a history of some bowel resection in the past with ongoing diarrhea since and other comorbidities who presents with acute shortness of breath and worsening dyspnea with associated palpitations.  Has a history of A-fib but not very compliant with her metoprolol  and has not been on any anticoagulation during her intolerance of Eliquis .  Was in A-fib with RVR and given her home metoprolol  and then subsequently converted to normal sinus rhythm became sinus tachycardia with rates in the 130s which sustained.  She is given 1 L bolus and continue complaint shortness of breath.  CTA PE protocol was done and was found to have it PE.  Being admitted for her dyspnea and has been accepted to be the progressive care unit as an inpatient for further workup for acute PE.    Has been placed on a heparin  drip and will be given another bolus of fluid and blood cultures will be obtained.  [facility] asked to admit this patient for acute dyspnea in the setting of PE will need further complete workup with echocardiogram and lower extremity venous duplex and improvement in her heart rate.  Of note her husband passed away last month

## 2023-12-28 ENCOUNTER — Inpatient Hospital Stay (HOSPITAL_COMMUNITY): Payer: Medicare Other

## 2023-12-28 DIAGNOSIS — I10 Essential (primary) hypertension: Secondary | ICD-10-CM

## 2023-12-28 DIAGNOSIS — I2699 Other pulmonary embolism without acute cor pulmonale: Secondary | ICD-10-CM | POA: Diagnosis not present

## 2023-12-28 DIAGNOSIS — K219 Gastro-esophageal reflux disease without esophagitis: Secondary | ICD-10-CM

## 2023-12-28 DIAGNOSIS — E538 Deficiency of other specified B group vitamins: Secondary | ICD-10-CM | POA: Diagnosis not present

## 2023-12-28 DIAGNOSIS — R0602 Shortness of breath: Secondary | ICD-10-CM | POA: Diagnosis not present

## 2023-12-28 DIAGNOSIS — Z86711 Personal history of pulmonary embolism: Secondary | ICD-10-CM | POA: Diagnosis not present

## 2023-12-28 DIAGNOSIS — F4321 Adjustment disorder with depressed mood: Secondary | ICD-10-CM

## 2023-12-28 LAB — TSH: TSH: 0.406 u[IU]/mL (ref 0.350–4.500)

## 2023-12-28 LAB — HEPARIN LEVEL (UNFRACTIONATED)
Heparin Unfractionated: 0.51 [IU]/mL (ref 0.30–0.70)
Heparin Unfractionated: 0.65 [IU]/mL (ref 0.30–0.70)
Heparin Unfractionated: 0.83 [IU]/mL — ABNORMAL HIGH (ref 0.30–0.70)

## 2023-12-28 LAB — ECHOCARDIOGRAM COMPLETE
Area-P 1/2: 4.58 cm2
Calc EF: 60.5 %
Height: 67 in
MV VTI: 2.59 cm2
S' Lateral: 2.4 cm
Single Plane A2C EF: 67.1 %
Single Plane A4C EF: 45.8 %
Weight: 2172.85 [oz_av]

## 2023-12-28 LAB — BASIC METABOLIC PANEL
Anion gap: 8 (ref 5–15)
BUN: 8 mg/dL (ref 8–23)
CO2: 27 mmol/L (ref 22–32)
Calcium: 8.2 mg/dL — ABNORMAL LOW (ref 8.9–10.3)
Chloride: 104 mmol/L (ref 98–111)
Creatinine, Ser: 0.58 mg/dL (ref 0.44–1.00)
GFR, Estimated: >60 mL/min (ref >60)
Glucose, Bld: 103 mg/dL — ABNORMAL HIGH (ref 70–99)
Potassium: 3.3 mmol/L — ABNORMAL LOW (ref 3.5–5.1)
Sodium: 139 mmol/L (ref 135–145)

## 2023-12-28 LAB — OCCULT BLOOD X 1 CARD TO LAB, STOOL: Fecal Occult Bld: NEGATIVE

## 2023-12-28 MED ORDER — GLYCERIN (LAXATIVE) 2 G RE SUPP
1.0000 | Freq: Once | RECTAL | Status: AC
  Administered 2023-12-28: 1 via RECTAL
  Filled 2023-12-28: qty 1

## 2023-12-28 MED ORDER — ENSURE ENLIVE PO LIQD
237.0000 mL | Freq: Two times a day (BID) | ORAL | Status: DC
  Administered 2023-12-28 – 2023-12-29 (×4): 237 mL via ORAL

## 2023-12-28 MED ORDER — BENZOCAINE 10 % MT GEL
Freq: Two times a day (BID) | OROMUCOSAL | Status: DC | PRN
  Filled 2023-12-28: qty 9.4

## 2023-12-28 MED ORDER — MELATONIN 3 MG PO TABS: 3.0000 mg | ORAL_TABLET | Freq: Every evening | ORAL | Status: DC | PRN

## 2023-12-28 MED ORDER — METOPROLOL SUCCINATE ER 25 MG PO TB24
12.5000 mg | ORAL_TABLET | Freq: Every day | ORAL | Status: DC
Start: 2023-12-28 — End: 2023-12-29
  Administered 2023-12-28: 12.5 mg via ORAL
  Filled 2023-12-28: qty 1

## 2023-12-28 MED ORDER — METOPROLOL TARTRATE 5 MG/5ML IV SOLN
2.5000 mg | Freq: Once | INTRAVENOUS | Status: AC
  Administered 2023-12-28: 2.5 mg via INTRAVENOUS
  Filled 2023-12-28: qty 5

## 2023-12-28 NOTE — Progress Notes (Signed)
 PHARMACY - ANTICOAGULATION CONSULT NOTE  Pharmacy Consult for heparin  Indication: pulmonary embolus  Allergies  Allergen Reactions   Tetanus Toxoids Shortness Of Breath and Rash   Nicotine Itching    Reaction Type: Allergy   Diazepam Other (See Comments) and Rash    Reaction Type: Allergy; Reaction(s): Visual hallucinations,unknown    Patient Measurements: Height: 5' 7 (170.2 cm) Weight: 61.6 kg (135 lb 12.9 oz) IBW/kg (Calculated) : 61.6 Heparin  Dosing Weight: 61.6 kg  Vital Signs: Temp: 98.2 F (36.8 C) (01/12 1950) Temp Source: Oral (01/12 1950) BP: 106/83 (01/12 1950) Pulse Rate: 95 (01/12 1950)  Labs: Recent Labs    12/27/23 1209 12/27/23 1357 12/27/23 2338 12/28/23 0834 12/28/23 1954  HGB 12.0  --  11.1*  --   --   HCT 35.4*  --  34.4*  --   --   PLT 218  --  215  --   --   HEPARINUNFRC  --   --  0.65 0.83* 0.51  CREATININE 0.98  --  0.58  --   --   TROPONINIHS 7 7  --   --   --     Estimated Creatinine Clearance: 56.4 mL/min (by C-G formula based on SCr of 0.58 mg/dL).   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  Eleanor EMERSON Agent, PharmD, BCPS Clinical Pharmacist [facility] 12/28/2023 8:36 PM

## 2023-12-28 NOTE — Progress Notes (Signed)
 PHARMACY - ANTICOAGULATION CONSULT NOTE  Pharmacy Consult for heparin  Indication: pulmonary embolus  Allergies  Allergen Reactions   Tetanus Toxoids Shortness Of Breath and Rash   Nicotine Itching    Reaction Type: Allergy   Diazepam Other (See Comments) and Rash    Reaction Type: Allergy; Reaction(s): Visual hallucinations,unknown    Patient Measurements: Height: 5' 7 (170.2 cm) Weight: 61.6 kg (135 lb 12.9 oz) IBW/kg (Calculated) : 61.6 Heparin  Dosing Weight: 61.6 kg  Vital Signs: Temp: 98.4 F (36.9 C) (01/12 0627) Temp Source: Oral (01/12 0627) BP: 118/68 (01/12 0627) Pulse Rate: 84 (01/12 0627)  Labs: Recent Labs    12/27/23 1209 12/27/23 1357 12/27/23 2338 12/28/23 0834  HGB 12.0  --  11.1*  --   HCT 35.4*  --  34.4*  --   PLT 218  --  215  --   HEPARINUNFRC  --   --  0.65 0.83*  CREATININE 0.98  --  0.58  --   TROPONINIHS 7 7  --   --     Estimated Creatinine Clearance: 56.4 mL/min (by C-G formula based on SCr of 0.58 mg/dL).   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   Vitamin B 12 deficiency     Assessment: 84 YOF presenting with SOB, CT angio chest with PE, she is not on anticoagulation PTA  Today, 12/28/23 08:34 HL 0.83 supra-therapeutic on 950 units/hr Hgb 11.1, plts 215 No complications of therapy per nurse  Goal of Therapy:  Heparin  level 0.3-0.7 units/ml Monitor platelets by anticoagulation protocol: Yes   Plan:  Decrease heparin  drip to 850 units/hr Obtain heparin  level in 8 hours after rate decrease Monitor daily heparin  level, CBC, signs/symptoms of bleeding F/u long term AC plan  Thank you for allowing pharmacy to be a part of this patient's care.  Eleanor EMERSON Agent, PharmD, BCPS Clinical Pharmacist [place] 12/28/2023 11:00  AM

## 2023-12-28 NOTE — Plan of Care (Signed)
  Problem: Education: Goal: Knowledge of General Education information will improve Description: Including pain rating scale, medication(s)/side effects and non-pharmacologic comfort measures Outcome: Progressing   Problem: Clinical Measurements: Goal: Ability to maintain clinical measurements within normal limits will improve Outcome: Progressing Goal: Will remain free from infection Outcome: Progressing Goal: Respiratory complications will improve Outcome: Progressing Goal: Cardiovascular complication will be avoided Outcome: Progressing   Problem: Activity: Goal: Risk for activity intolerance will decrease Outcome: Progressing   Problem: Nutrition: Goal: Adequate nutrition will be maintained Outcome: Progressing   Problem: Coping: Goal: Level of anxiety will decrease Outcome: Progressing   Problem: Pain Management: Goal: General experience of comfort will improve Outcome: Progressing   Problem: Safety: Goal: Ability to remain free from injury will improve Outcome: Progressing   Problem: Skin Integrity: Goal: Risk for impaired skin integrity will decrease Outcome: Progressing

## 2023-12-28 NOTE — Progress Notes (Signed)
  Echocardiogram 2D Echocardiogram has been performed.  Anita Dickerson 12/28/2023, 9:40 AM

## 2023-12-28 NOTE — Progress Notes (Signed)
 PHARMACY - ANTICOAGULATION CONSULT NOTE  Pharmacy Consult for heparin  Indication: pulmonary embolus  Allergies  Allergen Reactions   Tetanus Toxoids Shortness Of Breath and Rash   Nicotine Itching    Reaction Type: Allergy   Diazepam Other (See Comments) and Rash    Reaction Type: Allergy; Reaction(s): Visual hallucinations,unknown    Patient Measurements: Height: 5' 7 (170.2 cm) Weight: 61.6 kg (135 lb 12.9 oz) IBW/kg (Calculated) : 61.6 Heparin  Dosing Weight: TBW  Vital Signs: Temp: 98.3 F (36.8 C) (01/11 2049) Temp Source: Oral (01/11 2049) BP: 143/96 (01/11 2049) Pulse Rate: 98 (01/11 2049)  Labs: Recent Labs    12/27/23 1209 12/27/23 1357 12/27/23 2338  HGB 12.0  --  11.1*  HCT 35.4*  --  34.4*  PLT 218  --  215  HEPARINUNFRC  --   --  0.65  CREATININE 0.98  --   --   TROPONINIHS 7 7  --     Estimated Creatinine Clearance: 46 mL/min (by C-G formula based on SCr of 0.98 mg/dL).   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

## 2023-12-28 NOTE — Progress Notes (Signed)
 Lower extremity venous duplex completed. Please see CV Procedures for preliminary results.  Shona Simpson, RVT 12/28/23 11:08 AM

## 2023-12-29 ENCOUNTER — Other Ambulatory Visit (HOSPITAL_COMMUNITY): Payer: Self-pay

## 2023-12-29 ENCOUNTER — Telehealth (HOSPITAL_COMMUNITY): Payer: Self-pay | Admitting: Pharmacy Technician

## 2023-12-29 DIAGNOSIS — F4321 Adjustment disorder with depressed mood: Secondary | ICD-10-CM

## 2023-12-29 DIAGNOSIS — I2699 Other pulmonary embolism without acute cor pulmonale: Secondary | ICD-10-CM | POA: Diagnosis not present

## 2023-12-29 DIAGNOSIS — I480 Paroxysmal atrial fibrillation: Secondary | ICD-10-CM

## 2023-12-29 DIAGNOSIS — I10 Essential (primary) hypertension: Secondary | ICD-10-CM

## 2023-12-29 DIAGNOSIS — K219 Gastro-esophageal reflux disease without esophagitis: Secondary | ICD-10-CM

## 2023-12-29 LAB — CBC
HCT: 32.9 % — ABNORMAL LOW (ref 36.0–46.0)
Hemoglobin: 10.9 g/dL — ABNORMAL LOW (ref 12.0–15.0)
MCH: 30.1 pg (ref 26.0–34.0)
MCHC: 33.1 g/dL (ref 30.0–36.0)
MCV: 90.9 fL (ref 80.0–100.0)
Platelets: 211 10*3/uL (ref 150–400)
RBC: 3.62 MIL/uL — ABNORMAL LOW (ref 3.87–5.11)
RDW: 14.7 % (ref 11.5–15.5)
WBC: 4.7 10*3/uL (ref 4.0–10.5)
nRBC: 0 % (ref 0.0–0.2)

## 2023-12-29 LAB — COMPREHENSIVE METABOLIC PANEL
ALT: 16 U/L (ref 0–44)
AST: 18 U/L (ref 15–41)
Albumin: 3.5 g/dL (ref 3.5–5.0)
Alkaline Phosphatase: 47 U/L (ref 38–126)
Anion gap: 8 (ref 5–15)
BUN: 15 mg/dL (ref 8–23)
CO2: 27 mmol/L (ref 22–32)
Calcium: 8.7 mg/dL — ABNORMAL LOW (ref 8.9–10.3)
Chloride: 102 mmol/L (ref 98–111)
Creatinine, Ser: 0.87 mg/dL (ref 0.44–1.00)
GFR, Estimated: >60 mL/min (ref >60)
Glucose, Bld: 100 mg/dL — ABNORMAL HIGH (ref 70–99)
Potassium: 3 mmol/L — ABNORMAL LOW (ref 3.5–5.1)
Sodium: 137 mmol/L (ref 135–145)
Total Bilirubin: 0.5 mg/dL (ref 0.0–1.2)
Total Protein: 6.4 g/dL — ABNORMAL LOW (ref 6.5–8.1)

## 2023-12-29 LAB — MAGNESIUM: Magnesium: 2.1 mg/dL (ref 1.7–2.4)

## 2023-12-29 MED ORDER — POTASSIUM CHLORIDE ER 10 MEQ PO TBCR
40.0000 meq | EXTENDED_RELEASE_TABLET | Freq: Every day | ORAL | Status: DC
  Administered 2023-12-29: 40 meq via ORAL
  Filled 2023-12-29: qty 4

## 2023-12-29 MED ORDER — ENSURE ENLIVE PO LIQD: 237.0000 mL | Freq: Two times a day (BID) | ORAL | Status: DC

## 2023-12-29 MED ORDER — METOPROLOL SUCCINATE ER 25 MG PO TB24
25.0000 mg | ORAL_TABLET | Freq: Two times a day (BID) | ORAL | Status: DC
  Filled 2023-12-29: qty 1

## 2023-12-29 MED ORDER — METOPROLOL TARTRATE 5 MG/5ML IV SOLN
5.0000 mg | Freq: Once | INTRAVENOUS | Status: AC
  Administered 2023-12-29: 5 mg via INTRAVENOUS
  Filled 2023-12-29: qty 5

## 2023-12-29 MED ORDER — APIXABAN 5 MG PO TABS
ORAL_TABLET | ORAL | 0 refills | Status: DC
  Filled 2023-12-29: qty 60, 24d supply, fill #0

## 2023-12-29 MED ORDER — APIXABAN 5 MG PO TABS
10.0000 mg | ORAL_TABLET | Freq: Two times a day (BID) | ORAL | Status: DC
Start: 2023-12-29 — End: 2023-12-29
  Administered 2023-12-29: 10 mg via ORAL
  Filled 2023-12-29: qty 2

## 2023-12-29 MED ORDER — METOPROLOL SUCCINATE ER 25 MG PO TB24: 25.0000 mg | ORAL_TABLET | Freq: Two times a day (BID) | ORAL | Status: DC

## 2023-12-29 MED ORDER — APIXABAN 5 MG PO TABS: 5.0000 mg | ORAL_TABLET | Freq: Two times a day (BID) | ORAL | Status: DC

## 2023-12-29 MED ORDER — METOPROLOL TARTRATE 25 MG PO TABS: 25.0000 mg | ORAL_TABLET | Freq: Two times a day (BID) | ORAL | 0 refills | Status: DC

## 2023-12-29 MED ORDER — BENZOCAINE 10 % MT GEL
Freq: Two times a day (BID) | OROMUCOSAL | 0 refills | Status: DC | PRN
  Filled 2023-12-29: qty 5.3, 30d supply, fill #0

## 2023-12-29 MED ORDER — METOPROLOL TARTRATE 25 MG PO TABS
25.0000 mg | ORAL_TABLET | Freq: Two times a day (BID) | ORAL | 0 refills | Status: DC
  Filled 2023-12-29: qty 60, 30d supply, fill #0

## 2023-12-29 MED ORDER — APIXABAN 5 MG PO TABS
10.0000 mg | ORAL_TABLET | Freq: Two times a day (BID) | ORAL | Status: DC
  Administered 2023-12-29: 10 mg via ORAL
  Filled 2023-12-29: qty 2

## 2023-12-29 MED ORDER — BENZOCAINE 10 % MT GEL: Freq: Two times a day (BID) | OROMUCOSAL | 0 refills | Status: DC | PRN

## 2023-12-29 MED ORDER — APIXABAN 5 MG PO TABS: ORAL_TABLET | ORAL | 0 refills | Status: DC

## 2023-12-29 MED ORDER — PANTOPRAZOLE SODIUM 40 MG PO TBEC
40.0000 mg | DELAYED_RELEASE_TABLET | Freq: Every day | ORAL | Status: DC
  Administered 2023-12-29: 40 mg via ORAL
  Filled 2023-12-29: qty 1

## 2023-12-29 MED ORDER — METOPROLOL TARTRATE 25 MG PO TABS
25.0000 mg | ORAL_TABLET | Freq: Two times a day (BID) | ORAL | Status: DC
  Administered 2023-12-29: 25 mg via ORAL
  Filled 2023-12-29: qty 1

## 2023-12-29 NOTE — Discharge Summary (Signed)
 Physician Discharge Summary  Anita Dickerson FMW:[id number] DOB: 11-Jun-1946 DOA: 12/27/2023  PCP: Franchot Charlies GRADE, MD  Admit date: 12/27/2023 Discharge date: 12/29/2023  Admitted From: Home  Discharge disposition: Home   Recommendations for Outpatient Follow-Up:   Follow up with your primary care provider in one week.  Check CBC, BMP, magnesium in the next visit Follow-up with cardiology in 3 weeks.  Anticoagulation until that time for possible need for DC cardioversion if needed   Discharge Diagnosis:   Principal Problem:   Acute pulmonary embolism without acute cor pulmonale (HCC) Active Problems:   B12 deficiency   Paroxysmal atrial fibrillation with rapid ventricular response (HCC)   Essential hypertension   GERD without esophagitis   Grieving    Discharge Condition: Improved.  Diet recommendation: Soft diet.  Heart healthy  Wound care: None.  Code status: Full.   History of Present Illness:   78 y.o. 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  In the ED patient was noted to be interactive.  Atrial fibrillation.  CT angiogram of the chest showed acute pulmonary embolism and received IV beta-blocker and heparin  infusion.  Patient was then admitted hospital for further evaluation and treatment.    Hospital Course:   Following conditions were addressed during hospitalization as listed below,  Acute single subsegmental pulmonary embolism without acute cor pulmonale.  On Eliquis  at this time from initial IV heparin  infusion..  Patient had A-fib with elevated CHA2DS2-VASc score so we will will need lifelong anticoagulation.   Atrial fibrillation with RVR.  Used to follow-up with [facility] cardiology.  Status  post cryo ablation 12/2018.  She has seen the patient today and recommend metoprolol  titrated 25 twice daily instead of low-dose long-acting metoprolol  at home.  Cardiology recommends anticoagulation with Eliquis  for at least 3 weeks and follow-up in the clinic to see if she would need any DC cardioversion at that time.     Essential hypertension.  Continue metoprolol .  Dose has been increased on discharge.   GERD.  Continue PPI.   Vitamin B12 deficiency.  Continue monthly vitamin B12 injections.   Grief from recent family member loss.  Patient awaiting to make arrangements for her husband's cremation.   Facial involuntary movements.  States that it is from her implants and plan was to remove them as outpatient.  Seems to be worried about it.  Disposition.  At this time, patient is stable for disposition home with outpatient PCP and cardiology follow-up  Medical Consultants:   Cardiology  Procedures:    None Subjective:   Today, patient was seen and examined at bedside. She is bothered by involuntary movements of her lips because of her dental implants.  Feels sad and tearful about recent loss of her husband.   Discharge Exam:   Vitals:   12/29/23 1230 12/29/23 1405  BP: 112/78 103/79  Pulse: (!) 108 92  Resp:  18  Temp:  (!) 97.5 F (36.4 C)  SpO2:  100%   Vitals:   12/29/23 0956 12/29/23 1139 12/29/23 1230 12/29/23 1405  BP: 134/81 95/75 112/78 103/79  Pulse: (!) 108 97 (!) 108 92  Resp:  20  18  Temp:  97.6 F (36.4 C)  (!) 97.5 F (36.4 C)  TempSrc:  Oral  Oral  SpO2:  98%  100%  Weight:      Height:       GENERAL: Patient is alert awake and oriented. Not in obvious distress.  Anxious, HENT: No scleral pallor or icterus. Pupils equally reactive to light. Oral mucosa is moist.  Involuntary lip movements. NECK: is supple, no gross swelling noted. CHEST: Clear to auscultation. No crackles or wheezes.  CVS: S1 and S2 heard, no murmur.  irregularly irregular rhythm  with tachycardia ABDOMEN: Soft, non-tender, bowel sounds are present. EXTREMITIES: No edema. CNS: Cranial nerves are intact. No focal motor deficits. SKIN: warm and dry without rashes.  The results of significant diagnostics from this hospitalization (including imaging, microbiology, ancillary and laboratory) are listed below for reference.     Diagnostic Studies:   VAS US  LOWER EXTREMITY VENOUS (DVT) Result Date: 12/29/2023  Lower Venous DVT Study Patient Name:  Anita Dickerson  Date of Exam:   12/28/2023 Medical Rec #: [id number]         Accession #:    [id number] Date of Birth: 12-24-1945         Patient Gender: F Patient Age:   30 years Exam Location:  [facility] Procedure:      VAS US  LOWER EXTREMITY VENOUS (DVT) Referring Phys: WILL BERNARD --------------------------------------------------------------------------------  Indications: PE in history.  Risk Factors: Hx of PE. Anticoagulation: Heparin . Comparison Study: None. Performing Technologist: Garnette Rockers  Examination Guidelines: A complete evaluation includes B-mode imaging, spectral Doppler, color Doppler, and power Doppler as needed of all accessible portions of each vessel. Bilateral testing is considered an integral part of a complete examination. Limited examinations for reoccurring indications may be performed as noted. The reflux portion of the exam is performed with the patient in reverse Trendelenburg.  +---------+---------------+---------+-----------+----------+--------------+ RIGHT    CompressibilityPhasicitySpontaneityPropertiesThrombus Aging +---------+---------------+---------+-----------+----------+--------------+ CFV      Full           Yes      Yes                                 +---------+---------------+---------+-----------+----------+--------------+ SFJ      Full                                                         +---------+---------------+---------+-----------+----------+--------------+ FV Prox  Full                                                        +---------+---------------+---------+-----------+----------+--------------+ FV Mid   Full                                                        +---------+---------------+---------+-----------+----------+--------------+ FV DistalFull                                                        +---------+---------------+---------+-----------+----------+--------------+  PFV      Full                                                        +---------+---------------+---------+-----------+----------+--------------+ POP      Full           Yes      Yes                                 +---------+---------------+---------+-----------+----------+--------------+ PTV      Full                                                        +---------+---------------+---------+-----------+----------+--------------+ PERO     Full                                                        +---------+---------------+---------+-----------+----------+--------------+   +---------+---------------+---------+-----------+----------+--------------+ LEFT     CompressibilityPhasicitySpontaneityPropertiesThrombus Aging +---------+---------------+---------+-----------+----------+--------------+ CFV      Full           Yes      Yes                                 +---------+---------------+---------+-----------+----------+--------------+ SFJ      Full                                                        +---------+---------------+---------+-----------+----------+--------------+ FV Prox  Full                                                        +---------+---------------+---------+-----------+----------+--------------+ FV Mid   Full                                                         +---------+---------------+---------+-----------+----------+--------------+ FV DistalFull                                                        +---------+---------------+---------+-----------+----------+--------------+ PFV      Full                                                        +---------+---------------+---------+-----------+----------+--------------+  POP      Full           Yes      Yes                                 +---------+---------------+---------+-----------+----------+--------------+ PTV      Full                                                        +---------+---------------+---------+-----------+----------+--------------+ PERO     Full                                                        +---------+---------------+---------+-----------+----------+--------------+     Summary: BILATERAL: - No evidence of deep vein thrombosis seen in the lower extremities, bilaterally. -No evidence of popliteal cyst, bilaterally.   *See table(s) above for measurements and observations. Electronically signed by Penne Colorado MD on 12/29/2023 at 10:39:19 AM.    Final    ECHOCARDIOGRAM COMPLETE Result Date: 12/28/2023    ECHOCARDIOGRAM REPORT   Patient Name:   Anita Dickerson Date of Exam: 12/28/2023 Medical Rec #:  [id number]        Height:       67.0 in Accession #:    [id number]       Weight:       135.8 lb Date of Birth:  Jul 30, 1946        BSA:          1.715 m Patient Age:    77 years         BP:           118/68 mmHg Patient Gender: F                HR:           98 bpm. Exam Location:  Inpatient Procedure: 2D Echo, Cardiac Doppler and Color Doppler Indications:    R06.02 SOB  History:        Patient has no prior history of Echocardiogram examinations.                 Abnormal ECG; Arrythmias:Atrial Fibrillation. Pulmonary embolus.                 Ablation.  Sonographer:    Ellouise Mose RDCS Referring Phys: [id number] CLAUDIA CLAIBORNE IMPRESSIONS  1. Left ventricular ejection  fraction, by estimation, is 60 to 65%. The left ventricle has normal function. The left ventricle has no regional wall motion abnormalities. Left ventricular diastolic parameters are indeterminate.  2. Right ventricular systolic function is low normal. The right ventricular size is mildly enlarged. There is normal pulmonary artery systolic pressure.  3. The mitral valve is normal in structure. Mild mitral valve regurgitation.  4. The aortic valve is tricuspid. Aortic valve regurgitation is not visualized. FINDINGS  Left Ventricle: Left ventricular ejection fraction, by estimation, is 60 to 65%. The left ventricle has normal function. The left ventricle has no regional wall motion abnormalities. The left ventricular internal cavity size was normal in size. There is  no left ventricular hypertrophy. Left  ventricular diastolic parameters are indeterminate. Right Ventricle: The right ventricular size is mildly enlarged. Right vetricular wall thickness was not assessed. Right ventricular systolic function is low normal. There is normal pulmonary artery systolic pressure. The tricuspid regurgitant velocity is  2.17 m/s, and with an assumed right atrial pressure of 3 mmHg, the estimated right ventricular systolic pressure is 21.8 mmHg. Left Atrium: Left atrial size was normal in size. Right Atrium: Right atrial size was normal in size. Pericardium: There is no evidence of pericardial effusion. Mitral Valve: The mitral valve is normal in structure. Mild mitral valve regurgitation. MV peak gradient, 8.8 mmHg. The mean mitral valve gradient is 2.0 mmHg. Tricuspid Valve: The tricuspid valve is normal in structure. Tricuspid valve regurgitation is mild. Aortic Valve: The aortic valve is tricuspid. Aortic valve regurgitation is not visualized. Pulmonic Valve: The pulmonic valve was normal in structure. Pulmonic valve regurgitation is mild to moderate. Aorta: The aortic root and ascending aorta are structurally normal, with no  evidence of dilitation. IAS/Shunts: No atrial level shunt detected by color flow Doppler.  LEFT VENTRICLE PLAX 2D LVIDd:         3.40 cm LVIDs:         2.40 cm LV PW:         2.10 cm LV IVS:        1.00 cm LVOT diam:     2.30 cm LV SV:         66 LV SV Index:   38 LVOT Area:     4.15 cm  LV Volumes (MOD) LV vol d, MOD A2C: 54.7 ml LV vol d, MOD A4C: 41.5 ml LV vol s, MOD A2C: 18.0 ml LV vol s, MOD A4C: 22.5 ml LV SV MOD A2C:     36.7 ml LV SV MOD A4C:     41.5 ml LV SV MOD BP:      31.2 ml RIGHT VENTRICLE             IVC RV S prime:     11.60 cm/s  IVC diam: 2.10 cm TAPSE (M-mode): 1.2 cm LEFT ATRIUM             Index        RIGHT ATRIUM           Index LA diam:        3.30 cm 1.92 cm/m   RA Area:     15.00 cm LA Vol (A2C):   25.1 ml 14.63 ml/m  RA Volume:   36.90 ml  21.51 ml/m LA Vol (A4C):   38.3 ml 22.33 ml/m LA Biplane Vol: 32.0 ml 18.65 ml/m  AORTIC VALVE             PULMONIC VALVE LVOT Vmax:   83.60 cm/s  PR End Diast Vel: 1.49 msec LVOT Vmean:  53.600 cm/s LVOT VTI:    0.158 m  AORTA Ao Root diam: 3.20 cm Ao Asc diam:  3.30 cm MITRAL VALVE               TRICUSPID VALVE MV Area (PHT): 4.58 cm    TR Peak grad:   18.8 mmHg MV Area VTI:   2.59 cm    TR Vmax:        217.00 cm/s MV Peak grad:  8.8 mmHg MV Mean grad:  2.0 mmHg    SHUNTS MV Vmax:       1.48 m/s    Systemic VTI:  0.16 m MV Vmean:  66.4 cm/s   Systemic Diam: 2.30 cm MV Decel Time: 166 msec MV E velocity: 90.15 cm/s Vina Gull MD Electronically signed by Vina Gull MD Signature Date/Time: 12/28/2023/1:56:43 PM    Final    CT Angio Chest PE W and/or Wo Contrast Result Date: 12/27/2023 CLINICAL DATA:  Shortness of breath. High probability pulmonary embolism. EXAM: CT ANGIOGRAPHY CHEST WITH CONTRAST TECHNIQUE: Multidetector CT imaging of the chest was performed using the standard protocol during bolus administration of intravenous contrast. Multiplanar CT image reconstructions and MIPs were obtained to evaluate the vascular anatomy.  RADIATION DOSE REDUCTION: This exam was performed according to the departmental dose-optimization program which includes automated exposure control, adjustment of the mA and/or kV according to patient size and/or use of iterative reconstruction technique. CONTRAST:  75mL OMNIPAQUE  IOHEXOL  350 MG/ML SOLN COMPARISON:  None Available. FINDINGS: Cardiovascular: Single site of pulmonary embolism is seen in a subsegmental posterior right lower lobe pulmonary artery branch (e.g. Image 276/5). No other sites of pulmonary embolism identified. Thoracic aorta is not well opacified by contrast, however, there is no evidence of thoracic aortic aneurysm. Mediastinum/Nodes: No masses or pathologically enlarged lymph nodes identified. Lungs/Pleura: No pulmonary mass, infiltrate, or effusion. Upper abdomen: No acute findings. Musculoskeletal: No suspicious bone lesions identified. Review of the MIP images confirms the above findings. IMPRESSION: Single site of pulmonary embolism in a posterior subsegmental branch of the right lower lobe pulmonary artery. Critical Value/emergent results were called by telephone at the time of interpretation on 12/27/2023 at 1:55 pm to provider COOPER ROBBINS , who verbally acknowledged these results. Electronically Signed   By: Norleen DELENA Kil M.D.   On: 12/27/2023 14:06   DG Chest Port 1 View Result Date: 12/27/2023 CLINICAL DATA:  Short of breath EXAM: PORTABLE CHEST 1 VIEW COMPARISON:  12/05/2023 FINDINGS: Normal mediastinum and cardiac silhouette. Normal pulmonary vasculature. No evidence of effusion, infiltrate, or pneumothorax. No acute bony abnormality. IMPRESSION: No active disease. Electronically Signed   By: Jackquline Boxer M.D.   On: 12/27/2023 12:37     Labs:   Basic Metabolic Panel: Recent Labs  Lab 12/27/23 1209 12/27/23 2338 12/29/23 0353  NA 140 139 137  K 3.6 3.3* 3.0*  CL 100 104 102  CO2 30 27 27   GLUCOSE 92 103* 100*  BUN 8 8 15   CREATININE 0.98 0.58 0.87   CALCIUM 9.2 8.2* 8.7*  MG  --   --  2.1   GFR Estimated Creatinine Clearance: 51.8 mL/min (by C-G formula based on SCr of 0.87 mg/dL). Liver Function Tests: Recent Labs  Lab 12/29/23 0353  AST 18  ALT 16  ALKPHOS 47  BILITOT 0.5  PROT 6.4*  ALBUMIN 3.5   No results for input(s): LIPASE, AMYLASE in the last 168 hours. No results for input(s): AMMONIA in the last 168 hours. Coagulation profile No results for input(s): INR, PROTIME in the last 168 hours.  CBC: Recent Labs  Lab 12/27/23 1209 12/27/23 2338 12/29/23 0353  WBC 5.3 6.2 4.7  HGB 12.0 11.1* 10.9*  HCT 35.4* 34.4* 32.9*  MCV 89.4 93.5 90.9  PLT 218 215 211   Cardiac Enzymes: No results for input(s): CKTOTAL, CKMB, CKMBINDEX, TROPONINI in the last 168 hours. BNP: Invalid input(s): POCBNP CBG: No results for input(s): GLUCAP in the last 168 hours. D-Dimer No results for input(s): DDIMER in the last 72 hours. Hgb A1c No results for input(s): HGBA1C in the last 72 hours. Lipid Profile No results for input(s): CHOL, HDL, LDLCALC, TRIG, CHOLHDL,  LDLDIRECT in the last 72 hours. Thyroid  function studies Recent Labs    12/27/23 2338  TSH 0.406   Anemia work up No results for input(s): VITAMINB12, FOLATE, FERRITIN, TIBC, IRON, RETICCTPCT in the last 72 hours. Microbiology Recent Results (from the past 240 hours)  Resp panel by RT-PCR (RSV, Flu A&B, Covid) Anterior Nasal Swab     Status: None   Collection Time: 12/27/23 12:13 PM   Specimen: Anterior Nasal Swab  Result Value Ref Range Status   SARS Coronavirus 2 by RT PCR NEGATIVE NEGATIVE Final    Comment: (NOTE) SARS-CoV-2 target nucleic acids are NOT DETECTED.  The SARS-CoV-2 RNA is generally detectable in upper respiratory specimens during the acute phase of infection. The lowest concentration of SARS-CoV-2 viral copies this assay can detect is 138 copies/mL. A negative result does not preclude  SARS-Cov-2 infection and should not be used as the sole basis for treatment or other patient management decisions. A negative result may occur with  improper specimen collection/handling, submission of specimen other than nasopharyngeal swab, presence of viral mutation(s) within the areas targeted by this assay, and inadequate number of viral copies(<138 copies/mL). A negative result must be combined with clinical observations, patient history, and epidemiological information. The expected result is Negative.  Fact Sheet for Patients:  bloggercourse.com  Fact Sheet for Healthcare Providers:  seriousbroker.it  This test is no t yet approved or cleared by the [place]  FDA and  has been authorized for detection and/or diagnosis of SARS-CoV-2 by FDA under an Emergency Use Authorization (EUA). This EUA will remain  in effect (meaning this test can be used) for the duration of the COVID-19 declaration under Section 564(b)(1) of the Act, 21 U.S.C.section 360bbb-3(b)(1), unless the authorization is terminated  or revoked sooner.       Influenza A by PCR NEGATIVE NEGATIVE Final   Influenza B by PCR NEGATIVE NEGATIVE Final    Comment: (NOTE) The Xpert Xpress SARS-CoV-2/FLU/RSV plus assay is intended as an aid in the diagnosis of influenza from Nasopharyngeal swab specimens and should not be used as a sole basis for treatment. Nasal washings and aspirates are unacceptable for Xpert Xpress SARS-CoV-2/FLU/RSV testing.  Fact Sheet for Patients: bloggercourse.com  Fact Sheet for Healthcare Providers: seriousbroker.it  This test is not yet approved or cleared by the [place]  FDA and has been authorized for detection and/or diagnosis of SARS-CoV-2 by FDA under an Emergency Use Authorization (EUA). This EUA will remain in effect (meaning this test can be used) for the duration of  the COVID-19 declaration under Section 564(b)(1) of the Act, 21 U.S.C. section 360bbb-3(b)(1), unless the authorization is terminated or revoked.     Resp Syncytial Virus by PCR NEGATIVE NEGATIVE Final    Comment: (NOTE) Fact Sheet for Patients: bloggercourse.com  Fact Sheet for Healthcare Providers: seriousbroker.it  This test is not yet approved or cleared by the [place]  FDA and has been authorized for detection and/or diagnosis of SARS-CoV-2 by FDA under an Emergency Use Authorization (EUA). This EUA will remain in effect (meaning this test can be used) for the duration of the COVID-19 declaration under Section 564(b)(1) of the Act, 21 U.S.C. section 360bbb-3(b)(1), unless the authorization is terminated or revoked.  Performed at [facility], [address] [place], [place] [address]   Blood culture (routine x 2)     Status: None (Preliminary result)   Collection Time: 12/27/23  3:30 PM   Specimen: BLOOD RIGHT ARM  Result Value Ref Range Status  Specimen Description   Final    BLOOD RIGHT ARM Performed at [facility], [address] [place], [place] [address]    Special Requests   Final    BOTTLES DRAWN AEROBIC AND ANAEROBIC Blood Culture adequate volume Performed at [facility], [address], [place], [place] [address]    Culture   Final    NO GROWTH 2 DAYS Performed at [facility], [address] [place], [place] [address]    Report Status PENDING  Incomplete  Blood culture (routine x 2)     Status: None (Preliminary result)   Collection Time: 12/27/23  4:30 PM   Specimen: BLOOD LEFT ARM  Result Value Ref Range Status   Specimen Description   Final    BLOOD LEFT ARM Performed at [facility], [address], [place], [place] [address]    Special Requests   Final    BOTTLES DRAWN AEROBIC ONLY Blood Culture  adequate volume Performed at [facility], [address], [place], [place] [address]    Culture   Final    NO GROWTH 2 DAYS Performed at [facility], [address] [place], [place] [address]    Report Status PENDING  Incomplete     Discharge Instructions:   Discharge Instructions     Call MD for:  difficulty breathing, headache or visual disturbances   Complete by: As directed    Call MD for:  severe uncontrolled pain   Complete by: As directed    Call MD for:  temperature >100.4   Complete by: As directed    Diet - low sodium heart healthy   Complete by: As directed    Soft diet   Discharge instructions   Complete by: As directed    Follow-up with your primary care provider in 1 week.  Check blood work at that time.  Follow-up with cardiology as outpatient in 3 weeks..  Take medications as prescribed including blood thinners.    Seek medical attention for worsening symptoms.   Increase activity slowly   Complete by: As directed       Allergies as of 12/29/2023       Reactions   Tetanus Toxoids Shortness Of Breath, Rash   Nicotine Itching   Reaction Type: Allergy   Diazepam Other (See Comments), Rash   Reaction Type: Allergy; Reaction(s): Visual hallucinations,unknown        Medication List     STOP taking these medications    aspirin  EC 81 MG tablet   esomeprazole 40 MG capsule Commonly known as: NEXIUM   metoprolol  succinate 25 MG 24 hr tablet Commonly known as: TOPROL -XL   ondansetron  4 MG disintegrating tablet Commonly known as: ZOFRAN -ODT       TAKE these medications    apixaban  5 MG Tabs tablet Commonly known as: ELIQUIS  Take 2 tablets (10 mg total) by mouth 2 (two) times daily for 6 days, THEN 1 tablet (5 mg total) 2 (two) times daily. Start taking on: December 29, 2023   benzocaine  10 % mucosal gel Commonly known as: ORAJEL Use as directed in the mouth or throat 2 (two) times daily as needed for mouth pain.    cyanocobalamin  1000 MCG/ML injection Commonly known as: VITAMIN B12 Inject 1,000 mcg into the muscle every 30 (thirty) days.   cyclobenzaprine 5 MG tablet Commonly known as: FLEXERIL Take 5 mg by mouth at bedtime as needed for muscle spasms.   ergocalciferol  1.25 MG (  50000 UT) capsule Commonly known as: VITAMIN D2 Take 1 capsule by mouth every 7 (seven) days.   feeding supplement Liqd Take 237 mLs by mouth 2 (two) times daily between meals. Start taking on: December 30, 2023   hydrochlorothiazide  25 MG tablet Commonly known as: HYDRODIURIL  Take 1 tablet by mouth daily.   MAGNESIUM PO Take 1 tablet by mouth daily.   metoprolol  tartrate 25 MG tablet Commonly known as: LOPRESSOR  Take 1 tablet (25 mg total) by mouth 2 (two) times daily.   One Daily Calcium/Iron Tabs Take 1 capsule by mouth daily.   potassium chloride  10 MEQ tablet Commonly known as: KLOR-CON  Take 20 mEq by mouth daily.   VITAMIN C PO Take 1 tablet by mouth daily.        Follow-up Information     [employer]. Call.   Why: Rental assistance/rental hotline: [phone] ext. [phone].  Utility assistance/utility hotline: 779-324-7589 ext. [phone] Contact information: [address] [employer] [phone]        [facility] Department of Social Services. Call.   Contact informationSales [employer] (heating/cooling and water assistance) [phone]. Call.   Why: Rental and utility assistance Contact information: [phone]        [employer]. Call.   Contact information: Call 211 for utilities assistance        Goodrich, Callie E, PA-C Follow up.   Specialty: Cardiology Why: [facility] location - cardiology follow-up arranged on Monday Jan 19, 2024 at 10:05 AM. Arrive 15 minutes prior to appointment to check in. Callie is one of the PAs with our cardiology team. Contact information: [address] [place] [address] [phone]         Franchot Charlies GRADE, MD Follow up in 1 week(s).   Specialty: Internal Medicine Contact information: [address] [place] [address] [phone]                  Time coordinating discharge: 39 minutes  Signed:  Syair Fricker  [facility] 12/29/2023, 3:08 PM

## 2023-12-29 NOTE — Progress Notes (Addendum)
 PROGRESS NOTE  Anita Dickerson FMW:[id number] DOB: 1946-09-30 DOA: 12/27/2023 PCP: Franchot Charlies GRADE, MD   LOS: 2 days   Brief narrative:  78 y.o. 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  In the ED patient was noted to be interactive.  Atrial fibrillation.  CT angiogram of the chest showed acute pulmonary embolism and received IV beta-blocker and heparin  infusion.  Patient was then admitted hospital for further evaluation and treatment.   Assessment and plan.  Acute single subsegmental pulmonary embolism without acute cor pulmonale.  On Eliquis  at this time from initial IV heparin  infusion..  Patient had A-fib with elevated CHA2DS2-VASc score so we will will need lifelong anticoagulation.  Atrial fibrillation with RVR.  Used to follow-up with [facility] cardiology.  Status post cryo ablation 12/2018. Continue metoprolol .  Will consult cardiology for further evaluation and treatment and as per patient's wishes.  Spoke with cardiology at bedside regarding consultation  Essential hypertension.  Continue metoprolol .  GERD.  Continue PPI.  Vitamin B12 deficiency.  Continue monthly vitamin B12 injections.  Grief from recent family member loss.  Patient awaiting to make arrangements for her husband's cremation.  Facial involuntary movements.  States that it is from her implants and plan was to remove them as outpatient.  Seems to be worried about it.   DVT prophylaxis:  apixaban  (ELIQUIS ) tablet 10 mg  apixaban  (ELIQUIS ) tablet 5 mg   Disposition: Home likely in 1 to 2 days  Status is: Inpatient Remains inpatient appropriate because: A-fib with RVR, pulm embolism,    Code Status:     Code Status: Full Code  Family Communication:  None at bedside  Consultants: Cardiology  Procedures: None  Anti-infectives:  None  Anti-infectives (From admission, onward)    None        Subjective: Today, patient was seen and examined at bedside.  Patient states that she feels a little dizzy lightheaded when trying to stand up yesterday.  She is bothered by involuntary movements of her lips because of her dental implants.  Feels sad and tearful about recent loss of her husband.  Objective: Vitals:   12/29/23 0406 12/29/23 0956  BP: 109/72 134/81  Pulse: (!) 108 (!) 108  Resp: 17   Temp: 98.1 F (36.7 C)   SpO2: 97%     Intake/Output Summary (Last 24 hours) at 12/29/2023 1002 Last data filed at 12/29/2023 0100 Gross per 24 hour  Intake 1680 ml  Output --  Net 1680 ml   Filed Weights   12/27/23 1830  Weight: 61.6 kg   Body mass index is 21.27 kg/m.   Physical Exam:  GENERAL: Patient is alert awake and oriented. Not in obvious distress.  Anxious, HENT: No scleral pallor or icterus. Pupils equally reactive to light. Oral mucosa is moist.  Involuntary lip movements. NECK: is supple, no gross swelling noted. CHEST: Clear to auscultation. No crackles or wheezes.  CVS: S1 and S2 heard, no murmur.  irregularly irregular rhythm with tachycardia ABDOMEN: Soft, non-tender, bowel sounds are present. EXTREMITIES: No edema. CNS: Cranial nerves are intact. No focal motor deficits. SKIN: warm and dry without rashes.  Data Review: I have personally reviewed the following laboratory data and studies,  CBC: Recent Labs  Lab 12/27/23 1209 12/27/23 2338 12/29/23 0353  WBC 5.3 6.2 4.7  HGB 12.0 11.1* 10.9*  HCT 35.4* 34.4* 32.9*  MCV 89.4 93.5 90.9  PLT 218 215 211   Basic Metabolic Panel: Recent Labs  Lab 12/27/23 1209 12/27/23 2338 12/29/23 0353  NA 140 139 137  K 3.6 3.3* 3.0*  CL 100 104 102  CO2 30 27 27   GLUCOSE 92 103* 100*  BUN 8 8 15   CREATININE 0.98 0.58 0.87  CALCIUM 9.2 8.2* 8.7*  MG  --    --  2.1   Liver Function Tests: Recent Labs  Lab 12/29/23 0353  AST 18  ALT 16  ALKPHOS 47  BILITOT 0.5  PROT 6.4*  ALBUMIN 3.5   No results for input(s): LIPASE, AMYLASE in the last 168 hours. No results for input(s): AMMONIA in the last 168 hours. Cardiac Enzymes: No results for input(s): CKTOTAL, CKMB, CKMBINDEX, TROPONINI in the last 168 hours. BNP (last 3 results) Recent Labs    12/27/23 1209  BNP 219.3*    ProBNP (last 3 results) No results for input(s): PROBNP in the last 8760 hours.  CBG: No results for input(s): GLUCAP in the last 168 hours. Recent Results (from the past 240 hours)  Resp panel by RT-PCR (RSV, Flu A&B, Covid) Anterior Nasal Swab     Status: None   Collection Time: 12/27/23 12:13 PM   Specimen: Anterior Nasal Swab  Result Value Ref Range Status   SARS Coronavirus 2 by RT PCR NEGATIVE NEGATIVE Final    Comment: (NOTE) SARS-CoV-2 target nucleic acids are NOT DETECTED.  The SARS-CoV-2 RNA is generally detectable in upper respiratory specimens during the acute phase of infection. The lowest concentration of SARS-CoV-2 viral copies this assay can detect is 138 copies/mL. A negative result does not preclude SARS-Cov-2 infection and should not be used as the sole basis for treatment or other patient management decisions. A negative result may occur with  improper specimen collection/handling, submission of specimen other than nasopharyngeal swab, presence of viral mutation(s) within the areas targeted by this assay, and inadequate number of viral copies(<138 copies/mL). A negative result must be combined with clinical observations, patient history, and epidemiological information. The expected result is Negative.  Fact Sheet for Patients:  bloggercourse.com  Fact Sheet for Healthcare Providers:  seriousbroker.it  This test is no t yet approved or cleared by the [place]   FDA and  has been authorized for detection and/or diagnosis of SARS-CoV-2 by FDA under an Emergency Use Authorization (EUA). This EUA will remain  in effect (meaning this test can be used) for the duration of the COVID-19 declaration under Section 564(b)(1) of the Act, 21 U.S.C.section 360bbb-3(b)(1), unless the authorization is terminated  or revoked sooner.       Influenza A by PCR NEGATIVE NEGATIVE Final   Influenza B by PCR NEGATIVE NEGATIVE Final    Comment: (NOTE) The Xpert Xpress SARS-CoV-2/FLU/RSV plus assay is intended as an aid in the diagnosis of influenza from Nasopharyngeal swab specimens and should not be used as a sole basis for treatment. Nasal washings and aspirates are unacceptable for Xpert Xpress SARS-CoV-2/FLU/RSV testing.  Fact Sheet for Patients: bloggercourse.com  Fact Sheet for Healthcare Providers: seriousbroker.it  This test is not yet approved or cleared by the [place]  FDA and has been authorized for detection and/or diagnosis of SARS-CoV-2 by FDA under an Emergency Use Authorization (EUA). This EUA will remain in effect (meaning this test can be used) for the duration of the COVID-19 declaration under Section 564(b)(1) of the Act, 21 U.S.C. section  360bbb-3(b)(1), unless the authorization is terminated or revoked.     Resp Syncytial Virus by PCR NEGATIVE NEGATIVE Final    Comment: (NOTE) Fact Sheet for Patients: bloggercourse.com  Fact Sheet for Healthcare Providers: seriousbroker.it  This test is not yet approved or cleared by the [place]  FDA and has been authorized for detection and/or diagnosis of SARS-CoV-2 by FDA under an Emergency Use Authorization (EUA). This EUA will remain in effect (meaning this test can be used) for the duration of the COVID-19 declaration under Section 564(b)(1) of the Act, 21 U.S.C. section  360bbb-3(b)(1), unless the authorization is terminated or revoked.  Performed at [facility], [address] [place], [place] [address]   Blood culture (routine x 2)     Status: None (Preliminary result)   Collection Time: 12/27/23  3:30 PM   Specimen: BLOOD RIGHT ARM  Result Value Ref Range Status   Specimen Description   Final    BLOOD RIGHT ARM Performed at [facility], [address], [place], [place] [address]    Special Requests   Final    BOTTLES DRAWN AEROBIC AND ANAEROBIC Blood Culture adequate volume Performed at [facility], [address], [place], [place] [address]    Culture   Final    NO GROWTH 2 DAYS Performed at [facility], [address], [place], [place] [address]    Report Status PENDING  Incomplete  Blood culture (routine x 2)     Status: None (Preliminary result)   Collection Time: 12/27/23  4:30 PM   Specimen: BLOOD LEFT ARM  Result Value Ref Range Status   Specimen Description   Final    BLOOD LEFT ARM Performed at [facility], [address] [place], [place] [address]    Special Requests   Final    BOTTLES DRAWN AEROBIC ONLY Blood Culture adequate volume Performed at [facility], [address] [place], [place] [address]    Culture   Final    NO GROWTH 2 DAYS Performed at [facility], [address] [place], [place] [address]    Report Status PENDING  Incomplete     Studies: ECHOCARDIOGRAM COMPLETE Result Date: 12/28/2023    ECHOCARDIOGRAM REPORT   Patient Name:   DEETTE REVAK Date of Exam: 12/28/2023 Medical Rec #:  [id number]        Height:       67.0 in Accession #:    [id number]       Weight:       135.8 lb Date of Birth:  1946/10/31        BSA:          1.715 m Patient Age:    77 years         BP:           118/68 mmHg Patient Gender: F                HR:           98 bpm. Exam Location:  Inpatient Procedure: 2D Echo,  Cardiac Doppler and Color Doppler Indications:    R06.02 SOB  History:        Patient has no prior history of Echocardiogram examinations.                 Abnormal ECG; Arrythmias:Atrial Fibrillation. Pulmonary embolus.                 Ablation.  Sonographer:    Ellouise Mose RDCS Referring Phys: [id number] CLAUDIA CLAIBORNE IMPRESSIONS  1. Left ventricular ejection fraction, by estimation, is 60 to 65%. The left ventricle has normal function. The left ventricle has no regional wall motion abnormalities. Left ventricular diastolic parameters are indeterminate.  2. Right ventricular systolic function is low normal. The right ventricular size is mildly enlarged. There is normal pulmonary artery systolic pressure.  3. The mitral valve is normal in structure. Mild mitral valve regurgitation.  4. The aortic valve is tricuspid. Aortic valve regurgitation is not visualized. FINDINGS  Left Ventricle: Left ventricular ejection fraction, by estimation, is 60 to 65%. The left ventricle has normal function. The left ventricle has no regional wall motion abnormalities. The left ventricular internal cavity size was normal in size. There is  no left ventricular hypertrophy. Left ventricular diastolic parameters are indeterminate. Right Ventricle: The right ventricular size is mildly enlarged. Right vetricular wall thickness was not assessed. Right ventricular systolic function is low normal. There is normal pulmonary artery systolic pressure. The tricuspid regurgitant velocity is  2.17 m/s, and with an assumed right atrial pressure of 3 mmHg, the estimated right ventricular systolic pressure is 21.8 mmHg. Left Atrium: Left atrial size was normal in size. Right Atrium: Right atrial size was normal in size. Pericardium: There is no evidence of pericardial effusion. Mitral Valve: The mitral valve is normal in structure. Mild mitral valve regurgitation. MV peak gradient, 8.8 mmHg. The mean mitral valve gradient is 2.0 mmHg. Tricuspid Valve: The  tricuspid valve is normal in structure. Tricuspid valve regurgitation is mild. Aortic Valve: The aortic valve is tricuspid. Aortic valve regurgitation is not visualized. Pulmonic Valve: The pulmonic valve was normal in structure. Pulmonic valve regurgitation is mild to moderate. Aorta: The aortic root and ascending aorta are structurally normal, with no evidence of dilitation. IAS/Shunts: No atrial level shunt detected by color flow Doppler.  LEFT VENTRICLE PLAX 2D LVIDd:         3.40 cm LVIDs:         2.40 cm LV PW:         2.10 cm LV IVS:        1.00 cm LVOT diam:     2.30 cm LV SV:         66 LV SV Index:   38 LVOT Area:     4.15 cm  LV Volumes (MOD) LV vol d, MOD A2C: 54.7 ml LV vol d, MOD A4C: 41.5 ml LV vol s, MOD A2C: 18.0 ml LV vol s, MOD A4C: 22.5 ml LV SV MOD A2C:     36.7 ml LV SV MOD A4C:     41.5 ml LV SV MOD BP:      31.2 ml RIGHT VENTRICLE             IVC RV S prime:     11.60 cm/s  IVC diam: 2.10 cm TAPSE (M-mode): 1.2 cm LEFT ATRIUM             Index        RIGHT ATRIUM           Index LA diam:        3.30 cm 1.92 cm/m   RA Area:     15.00 cm LA Vol (A2C):   25.1 ml 14.63 ml/m  RA Volume:   36.90 ml  21.51 ml/m LA Vol (A4C):   38.3 ml 22.33 ml/m LA Biplane Vol: 32.0 ml 18.65 ml/m  AORTIC VALVE  PULMONIC VALVE LVOT Vmax:   83.60 cm/s  PR End Diast Vel: 1.49 msec LVOT Vmean:  53.600 cm/s LVOT VTI:    0.158 m  AORTA Ao Root diam: 3.20 cm Ao Asc diam:  3.30 cm MITRAL VALVE               TRICUSPID VALVE MV Area (PHT): 4.58 cm    TR Peak grad:   18.8 mmHg MV Area VTI:   2.59 cm    TR Vmax:        217.00 cm/s MV Peak grad:  8.8 mmHg MV Mean grad:  2.0 mmHg    SHUNTS MV Vmax:       1.48 m/s    Systemic VTI:  0.16 m MV Vmean:      66.4 cm/s   Systemic Diam: 2.30 cm MV Decel Time: 166 msec MV E velocity: 90.15 cm/s Vina Gull MD Electronically signed by Vina Gull MD Signature Date/Time: 12/28/2023/1:56:43 PM    Final    VAS US  LOWER EXTREMITY VENOUS (DVT) Result Date: 12/28/2023  Lower  Venous DVT Study Patient Name:  AGNES BRIGHTBILL  Date of Exam:   12/28/2023 Medical Rec #: [id number]         Accession #:    [id number] Date of Birth: 10-18-1946         Patient Gender: F Patient Age:   57 years Exam Location:  [facility] Procedure:      VAS US  LOWER EXTREMITY VENOUS (DVT) Referring Phys: WILL BERNARD --------------------------------------------------------------------------------  Indications: PE in history.  Risk Factors: Hx of PE. Anticoagulation: Heparin . Comparison Study: None. Performing Technologist: Garnette Rockers  Examination Guidelines: A complete evaluation includes B-mode imaging, spectral Doppler, color Doppler, and power Doppler as needed of all accessible portions of each vessel. Bilateral testing is considered an integral part of a complete examination. Limited examinations for reoccurring indications may be performed as noted. The reflux portion of the exam is performed with the patient in reverse Trendelenburg.  +---------+---------------+---------+-----------+----------+--------------+ RIGHT    CompressibilityPhasicitySpontaneityPropertiesThrombus Aging +---------+---------------+---------+-----------+----------+--------------+ CFV      Full           Yes      Yes                                 +---------+---------------+---------+-----------+----------+--------------+ SFJ      Full                                                        +---------+---------------+---------+-----------+----------+--------------+ FV Prox  Full                                                        +---------+---------------+---------+-----------+----------+--------------+ FV Mid   Full                                                        +---------+---------------+---------+-----------+----------+--------------+ FV DistalFull                                                         +---------+---------------+---------+-----------+----------+--------------+  PFV      Full                                                        +---------+---------------+---------+-----------+----------+--------------+ POP      Full           Yes      Yes                                 +---------+---------------+---------+-----------+----------+--------------+ PTV      Full                                                        +---------+---------------+---------+-----------+----------+--------------+ PERO     Full                                                        +---------+---------------+---------+-----------+----------+--------------+   +---------+---------------+---------+-----------+----------+--------------+ LEFT     CompressibilityPhasicitySpontaneityPropertiesThrombus Aging +---------+---------------+---------+-----------+----------+--------------+ CFV      Full           Yes      Yes                                 +---------+---------------+---------+-----------+----------+--------------+ SFJ      Full                                                        +---------+---------------+---------+-----------+----------+--------------+ FV Prox  Full                                                        +---------+---------------+---------+-----------+----------+--------------+ FV Mid   Full                                                        +---------+---------------+---------+-----------+----------+--------------+ FV DistalFull                                                        +---------+---------------+---------+-----------+----------+--------------+ PFV      Full                                                        +---------+---------------+---------+-----------+----------+--------------+  POP      Full           Yes      Yes                                  +---------+---------------+---------+-----------+----------+--------------+ PTV      Full                                                        +---------+---------------+---------+-----------+----------+--------------+ PERO     Full                                                        +---------+---------------+---------+-----------+----------+--------------+    Summary: BILATERAL: - No evidence of deep vein thrombosis seen in the lower extremities, bilaterally. -No evidence of popliteal cyst, bilaterally.   *See table(s) above for measurements and observations.    Preliminary    CT Angio Chest PE W and/or Wo Contrast Result Date: 12/27/2023 CLINICAL DATA:  Shortness of breath. High probability pulmonary embolism. EXAM: CT ANGIOGRAPHY CHEST WITH CONTRAST TECHNIQUE: Multidetector CT imaging of the chest was performed using the standard protocol during bolus administration of intravenous contrast. Multiplanar CT image reconstructions and MIPs were obtained to evaluate the vascular anatomy. RADIATION DOSE REDUCTION: This exam was performed according to the departmental dose-optimization program which includes automated exposure control, adjustment of the mA and/or kV according to patient size and/or use of iterative reconstruction technique. CONTRAST:  75mL OMNIPAQUE  IOHEXOL  350 MG/ML SOLN COMPARISON:  None Available. FINDINGS: Cardiovascular: Single site of pulmonary embolism is seen in a subsegmental posterior right lower lobe pulmonary artery branch (e.g. Image 276/5). No other sites of pulmonary embolism identified. Thoracic aorta is not well opacified by contrast, however, there is no evidence of thoracic aortic aneurysm. Mediastinum/Nodes: No masses or pathologically enlarged lymph nodes identified. Lungs/Pleura: No pulmonary mass, infiltrate, or effusion. Upper abdomen: No acute findings. Musculoskeletal: No suspicious bone lesions identified. Review of the MIP images confirms the above  findings. IMPRESSION: Single site of pulmonary embolism in a posterior subsegmental branch of the right lower lobe pulmonary artery. Critical Value/emergent results were called by telephone at the time of interpretation on 12/27/2023 at 1:55 pm to provider COOPER ROBBINS , who verbally acknowledged these results. Electronically Signed   By: Norleen DELENA Kil M.D.   On: 12/27/2023 14:06   DG Chest Port 1 View Result Date: 12/27/2023 CLINICAL DATA:  Short of breath EXAM: PORTABLE CHEST 1 VIEW COMPARISON:  12/05/2023 FINDINGS: Normal mediastinum and cardiac silhouette. Normal pulmonary vasculature. No evidence of effusion, infiltrate, or pneumothorax. No acute bony abnormality. IMPRESSION: No active disease. Electronically Signed   By: Jackquline Boxer M.D.   On: 12/27/2023 12:37      Vernal Alstrom, MD  [facility] 12/29/2023  If 7PM-7AM, please contact night-coverage

## 2023-12-29 NOTE — Assessment & Plan Note (Signed)
 Monthly B12 injections as an outpatient.

## 2023-12-29 NOTE — Plan of Care (Signed)
  Problem: Health Behavior/Discharge Planning: Goal: Ability to manage health-related needs will improve Outcome: Completed/Met   Problem: Clinical Measurements: Goal: Ability to maintain clinical measurements within normal limits will improve Outcome: Completed/Met Goal: Will remain free from infection Outcome: Completed/Met Goal: Diagnostic test results will improve Outcome: Completed/Met Goal: Respiratory complications will improve Outcome: Completed/Met Goal: Cardiovascular complication will be avoided Outcome: Completed/Met   Problem: Activity: Goal: Risk for activity intolerance will decrease Outcome: Completed/Met   Problem: Nutrition: Goal: Adequate nutrition will be maintained Outcome: Completed/Met   Problem: Coping: Goal: Level of anxiety will decrease Outcome: Completed/Met   Problem: Elimination: Goal: Will not experience complications related to bowel motility Outcome: Completed/Met Goal: Will not experience complications related to urinary retention Outcome: Completed/Met   Problem: Pain Management: Goal: General experience of comfort will improve Outcome: Completed/Met   Problem: Safety: Goal: Ability to remain free from injury will improve Outcome: Completed/Met   Problem: Skin Integrity: Goal: Risk for impaired skin integrity will decrease Outcome: Completed/Met

## 2023-12-29 NOTE — Assessment & Plan Note (Signed)
 On heparin  infusion, will transition to Eliquis   Will monitor overnight on Eliquis  and if tolerated well can be discharged tomorrow. Since patient has atrial fibrillation anyway with an elevated Chads Vasc score will need lifelong therapy.   No evidence of right heart strain on echocardiogram  concerning the causation, patient reports multiple recent long car trips and a recent fall approximately 1 week ago resulting in him significant bruising to her right flank.  Patient reports regular mammograms all of which were unremarkable and an unremarkable colonoscopy in the last 10 years.  Patient denies recent unintentional weight loss.

## 2023-12-29 NOTE — Consult Note (Signed)
 Cardiology Consultation   Patient ID: Anita Dickerson MRN: [id number]; DOB: [birth year]  Admit date: 12/27/2023 Date of Consult: 12/29/2023  PCP:  Franchot Charlies GRADE, MD   [facility] Providers Cardiologist: [facility] Click here to update MD or APP on Care Team, Refresh:1}     Patient Profile:   Anita Dickerson is a 78 y.o. 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 Sonjia.  History of Present Illness:   Anita Dickerson has been followed by [facility] Cardiology/EP team for AFib, PACs, PSVT. She states she wound up at [facility] after seeking multiple medical opinions at varying facilities. She prayed hard about it and the Lord told her she needed to go to a facility that started with a V. She subsequently centralized all of her care there though states given the recent death of her husband will be transferring her central care to [facility] on [address]. She had ablation in 2020. Last monitor (worn 4 days 17 hours) for palpitations showed NSR with 7.7% PACs. Per last OV 04/2023, given PAC burden and severely dilated LA, she was felt to be at risk for recurrent atrial fib (given intermittent palpitations) and recommended for Tikosyn but she did not wish to take anticoagulation so this was not pursued. She reports she previously stopped it after she cut her hand and it bled really badley. Last outside echo 10/2022 showed EF 65-70%, moderate MR, mild pHTN, mild-moderate TR. Remote cMRI 2020 showed no LGE. She was also on metoprolol  in the past but states she hasn't taken it since her ablation.  She presented to the [facility] ED 12/27/23 with complaints of SOB and extremely concern that she has  developed an allergy to the chronic dental implants she's had for a while. She states she's talked to multiple people about this and heard that one man even died from them so she is frantic to have this issue resolved. 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 She has had significant weight loss, difficulty chewing, and mouth discomfort so has been trying to get those removed. She also recently lost her husband. CXR nonacute. CTA showed RLL PE. Venous duplex negative for PE. She was also noted to be in AF RVR. EKGs appear to show both rapid Afib and atrial flutter vs SVT. 2D echo here showed EF 60-65%, low normal RV function, mildly enlarged RV, normal PASP, mild MR.  She was started on heparin  then transitioned to PE dosing Eliquis . She also received IVF and metoprolol  (25mg  Toprol  on 1/11, 2.5mg  IV Lopressor  + 12.5mg  Toprol  on 1/12, then written for Toprol  25mg  BID today. HR remains 1teens-120s. Last SBP 130s/80s. She is aware of her heart racing. No CP. SOB improved. Her primary complaint remains focused on her dental implant issues.   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 Authorizing Provider  Ascorbic Acid (VITAMIN C PO) Take 1 tablet by mouth daily.   Yes [provider]  aspirin  EC 81 MG tablet Take 81-162 mg by mouth daily.   Yes [provider]  ergocalciferol  (VITAMIN D2) 1.25 MG (50000 UT) capsule Take 1 capsule by mouth every 7 (seven) days. 02/27/22   Yes [provider]  hydrochlorothiazide  (HYDRODIURIL ) 25 MG tablet Take 1 tablet by mouth daily. 10/28/23 10/27/24 Yes [provider]  MAGNESIUM PO Take 1 tablet by mouth daily.   Yes [provider]  metoprolol  succinate (TOPROL -XL) 25 MG 24 hr tablet Take 12.5 mg by mouth in the morning. 10/28/23 10/27/24 Yes [provider]  Multiple Vitamins-Minerals (ONE DAILY CALCIUM/IRON) TABS Take 1 capsule by mouth daily.   Yes [provider]  potassium chloride  (KLOR-CON ) 10 MEQ tablet Take 20 mEq by mouth daily. 05/19/23 05/18/24 Yes [provider]  cyanocobalamin  (VITAMIN B12) 1000 MCG/ML injection Inject 1,000 mcg into the muscle every 30 (thirty) days. Patient not taking: Reported on 12/27/2023    [provider]  cyclobenzaprine (FLEXERIL) 5 MG tablet Take 5 mg by mouth at bedtime as needed for muscle spasms. Patient not taking: Reported on 12/27/2023 10/31/23 01/29/24  [provider]  esomeprazole (NEXIUM) 40 MG capsule Take 40 mg by mouth daily as needed (heartburn). Patient not taking: Reported on 12/27/2023 10/28/23 10/27/24  [provider]  ondansetron  (ZOFRAN -ODT) 4 MG disintegrating tablet Take 4 mg by mouth 3 (three) times daily as needed for vomiting or nausea. Patient not taking: Reported on 12/27/2023 12/18/23   [provider]    Inpatient Medications: Scheduled Meds:  apixaban   10 mg Oral BID   Followed by   NOREEN ON 01/04/2024] apixaban   5 mg Oral BID   feeding supplement  237 mL Oral BID BM   metoprolol  succinate  25 mg Oral BID   pantoprazole   40 mg Oral Daily   potassium chloride   40 mEq Oral Daily   Continuous Infusions:  PRN Meds: acetaminophen  **OR** acetaminophen , benzocaine , melatonin, ondansetron  **OR** ondansetron  (ZOFRAN ) IV, mouth rinse, senna-docusate  Allergies:    Allergies  Allergen Reactions   Tetanus Toxoids Shortness Of Breath and Rash   Nicotine Itching    Reaction  Type: Allergy   Diazepam Other (See Comments) and Rash    Reaction Type: Allergy; Reaction(s): Visual hallucinations,unknown    Social History:   Social History   Socioeconomic History   Marital status: Married    Spouse name: Not on file   Number of children: Not on file   Years of education: Not on file   Highest education level: Not on file  Occupational History   Not on file  Tobacco Use   Smoking status: Never   Smokeless tobacco: Never  Vaping Use   Vaping status: Never Used  Substance and Sexual Activity   Alcohol use: No   Drug use: No   Sexual activity: Not on file  Other Topics Concern   Not on file  Social History Narrative   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      Home Situation: lives with husband      Spiritual Beliefs:  Christian, Ordained pastor, Optician, Dispensing      Lifestyle:             Social Drivers of Health   Financial Resource Strain: Low Risk  (07/17/2022)   Received from [facility]   Overall Financial Resource Strain (CARDIA)    Difficulty of Paying Living Expenses: Not hard at all  Food Insecurity: No Food Insecurity (12/27/2023)   Hunger Vital Sign    Worried About Running Out of Food in the Last Year: Never true    Ran Out of Food in the Last Year: Never true  Transportation Needs: No Transportation Needs (12/27/2023)   PRAPARE - Administrator, Civil Service (Medical): No    Lack of Transportation (Non-Medical): No  Physical Activity: Not on file  Stress: Not on file  Social Connections: Socially Isolated (12/27/2023)   Social Connection and Isolation Panel [NHANES]    Frequency of Communication with Friends and Family: More than three times a week    Frequency of Social Gatherings with Friends and Family: Never    Attends Religious Services: Never    Database Administrator or Organizations: No    Attends Banker Meetings: Never    Marital Status: Widowed  Intimate  Partner Violence: Not At Risk (12/27/2023)   Humiliation, Afraid, Rape, and Kick questionnaire    Fear of Current or Ex-Partner: No    Emotionally Abused: No    Physically Abused: No    Sexually Abused: No    Family History:   Family History  Problem Relation Age of Onset   Heart disease Mother    Diabetes Father    Kidney failure Father    Diabetes Sister    Hypertension Sister    Heart disease Sister      ROS:  Please see the history of present illness.   All other ROS reviewed and negative.     Physical Exam/Data:   Vitals:   12/28/23 0627 12/28/23 1100 12/28/23 1950 12/29/23 0406  BP: 118/68 119/82 106/83 109/72  Pulse: 84 80 95 (!) 108  Resp: 18 17 17 17   Temp: 98.4 F (36.9 C) 97.9 F (36.6 C) 98.2 F (36.8 C) 98.1 F (36.7 C)  TempSrc: Oral Oral Oral Oral  SpO2: 100% 97% 100% 97%  Weight:      Height:        Intake/Output Summary (Last 24 hours) at 12/29/2023 0935 Last data filed at 12/29/2023 0100 Gross per 24 hour  Intake 1680 ml  Output --  Net 1680 ml      12/27/2023    6:30 PM 08/30/2017   10:55 AM 02/13/2015   10:17 AM  Last 3 Weights  Weight (lbs) 135 lb 12.9 oz 125 lb --  Weight (kg) 61.6 kg 56.7 kg --     Body mass index is 21.27 kg/m.  General: Well developed, well nourished, in no acute distress. Head: Normocephalic, atraumatic, sclera non-icteric, no xanthomas, nares are without discharge. Neck: Negative for carotid bruits. JVP not elevated. Lungs: Clear bilaterally to auscultation without wheezes, rales, or rhonchi. Breathing is unlabored. Heart: Irregular, rapid, S1 S2 without murmurs, rubs, or gallops.  Abdomen: Soft, non-tender, non-distended with normoactive bowel sounds. No rebound/guarding. Extremities: No clubbing or cyanosis. No edema. Distal pedal pulses are 2+ and equal bilaterally. Neuro: Alert and oriented X 3. Moves all extremities spontaneously. Fasciculations around mouth noted when patient begins speaking about  them. Psych:  Responds to questions appropriately with a  normal affect.   EKG:  The EKG was personally reviewed and demonstrates:  EKGs reviewed, appear to show both AF RVR and atrial flutter with RVR, nonspecific STTW changes. The latter shows iRBBB Telemetry:  Telemetry was personally reviewed and demonstrates:  AF RVR  Relevant CV Studies: Summarized above  Here: 2D echo 12/28/23   1. Left ventricular ejection fraction, by estimation, is 60 to 65%. The  left ventricle has normal function. The left ventricle has no regional  wall motion abnormalities. Left ventricular diastolic parameters are  indeterminate.   2. Right ventricular systolic function is low normal. The right  ventricular size is mildly enlarged. There is normal pulmonary artery  systolic pressure.   3. The mitral valve is normal in structure. Mild mitral valve  regurgitation.   4. The aortic valve is tricuspid. Aortic valve regurgitation is not  visualized.   FINDINGS   Laboratory Data:  High Sensitivity Troponin:   Recent Labs  Lab 12/05/23 1415 12/05/23 1911 12/27/23 1209 12/27/23 1357  TROPONINIHS 8 7 7 7      Chemistry Recent Labs  Lab 12/27/23 1209 12/27/23 2338 12/29/23 0353  NA 140 139 137  K 3.6 3.3* 3.0*  CL 100 104 102  CO2 30 27 27   GLUCOSE 92 103* 100*  BUN 8 8 15   CREATININE 0.98 0.58 0.87  CALCIUM 9.2 8.2* 8.7*  MG  --   --  2.1  GFRNONAA 59* >60 >60  ANIONGAP 10 8 8     Recent Labs  Lab 12/29/23 0353  PROT 6.4*  ALBUMIN 3.5  AST 18  ALT 16  ALKPHOS 47  BILITOT 0.5   Lipids No results for input(s): CHOL, TRIG, HDL, LABVLDL, LDLCALC, CHOLHDL in the last 168 hours.  Hematology Recent Labs  Lab 12/27/23 1209 12/27/23 2338 12/29/23 0353  WBC 5.3 6.2 4.7  RBC 3.96 3.68* 3.62*  HGB 12.0 11.1* 10.9*  HCT 35.4* 34.4* 32.9*  MCV 89.4 93.5 90.9  MCH 30.3 30.2 30.1  MCHC 33.9 32.3 33.1  RDW 14.6 14.6 14.7  PLT 218 215 211   Thyroid   Recent Labs  Lab  12/27/23 2338  TSH 0.406    BNP Recent Labs  Lab 12/27/23 1209  BNP 219.3*    DDimer No results for input(s): DDIMER in the last 168 hours.   Radiology/Studies:  ECHOCARDIOGRAM COMPLETE Result Date: 12/28/2023    ECHOCARDIOGRAM REPORT   Patient Name:   SHATASIA CUTSHAW Date of Exam: 12/28/2023 Medical Rec #:  [id number]        Height:       67.0 in Accession #:    [id number]       Weight:       135.8 lb Date of Birth:  09-04-1946        BSA:          1.715 m Patient Age:    77 years         BP:           118/68 mmHg Patient Gender: F                HR:           98 bpm. Exam Location:  Inpatient Procedure: 2D Echo, Cardiac Doppler and Color Doppler Indications:    R06.02 SOB  History:        Patient has no prior history of Echocardiogram examinations.  Abnormal ECG; Arrythmias:Atrial Fibrillation. Pulmonary embolus.                 Ablation.  Sonographer:    Ellouise Mose RDCS Referring Phys: [id number] CLAUDIA CLAIBORNE IMPRESSIONS  1. Left ventricular ejection fraction, by estimation, is 60 to 65%. The left ventricle has normal function. The left ventricle has no regional wall motion abnormalities. Left ventricular diastolic parameters are indeterminate.  2. Right ventricular systolic function is low normal. The right ventricular size is mildly enlarged. There is normal pulmonary artery systolic pressure.  3. The mitral valve is normal in structure. Mild mitral valve regurgitation.  4. The aortic valve is tricuspid. Aortic valve regurgitation is not visualized. FINDINGS  Left Ventricle: Left ventricular ejection fraction, by estimation, is 60 to 65%. The left ventricle has normal function. The left ventricle has no regional wall motion abnormalities. The left ventricular internal cavity size was normal in size. There is  no left ventricular hypertrophy. Left ventricular diastolic parameters are indeterminate. Right Ventricle: The right ventricular size is mildly enlarged. Right vetricular wall  thickness was not assessed. Right ventricular systolic function is low normal. There is normal pulmonary artery systolic pressure. The tricuspid regurgitant velocity is  2.17 m/s, and with an assumed right atrial pressure of 3 mmHg, the estimated right ventricular systolic pressure is 21.8 mmHg. Left Atrium: Left atrial size was normal in size. Right Atrium: Right atrial size was normal in size. Pericardium: There is no evidence of pericardial effusion. Mitral Valve: The mitral valve is normal in structure. Mild mitral valve regurgitation. MV peak gradient, 8.8 mmHg. The mean mitral valve gradient is 2.0 mmHg. Tricuspid Valve: The tricuspid valve is normal in structure. Tricuspid valve regurgitation is mild. Aortic Valve: The aortic valve is tricuspid. Aortic valve regurgitation is not visualized. Pulmonic Valve: The pulmonic valve was normal in structure. Pulmonic valve regurgitation is mild to moderate. Aorta: The aortic root and ascending aorta are structurally normal, with no evidence of dilitation. IAS/Shunts: No atrial level shunt detected by color flow Doppler.  LEFT VENTRICLE PLAX 2D LVIDd:         3.40 cm LVIDs:         2.40 cm LV PW:         2.10 cm LV IVS:        1.00 cm LVOT diam:     2.30 cm LV SV:         66 LV SV Index:   38 LVOT Area:     4.15 cm  LV Volumes (MOD) LV vol d, MOD A2C: 54.7 ml LV vol d, MOD A4C: 41.5 ml LV vol s, MOD A2C: 18.0 ml LV vol s, MOD A4C: 22.5 ml LV SV MOD A2C:     36.7 ml LV SV MOD A4C:     41.5 ml LV SV MOD BP:      31.2 ml RIGHT VENTRICLE             IVC RV S prime:     11.60 cm/s  IVC diam: 2.10 cm TAPSE (M-mode): 1.2 cm LEFT ATRIUM             Index        RIGHT ATRIUM           Index LA diam:        3.30 cm 1.92 cm/m   RA Area:     15.00 cm LA Vol (A2C):   25.1 ml 14.63 ml/m  RA Volume:  36.90 ml  21.51 ml/m LA Vol (A4C):   38.3 ml 22.33 ml/m LA Biplane Vol: 32.0 ml 18.65 ml/m  AORTIC VALVE             PULMONIC VALVE LVOT Vmax:   83.60 cm/s  PR End Diast Vel:  1.49 msec LVOT Vmean:  53.600 cm/s LVOT VTI:    0.158 m  AORTA Ao Root diam: 3.20 cm Ao Asc diam:  3.30 cm MITRAL VALVE               TRICUSPID VALVE MV Area (PHT): 4.58 cm    TR Peak grad:   18.8 mmHg MV Area VTI:   2.59 cm    TR Vmax:        217.00 cm/s MV Peak grad:  8.8 mmHg MV Mean grad:  2.0 mmHg    SHUNTS MV Vmax:       1.48 m/s    Systemic VTI:  0.16 m MV Vmean:      66.4 cm/s   Systemic Diam: 2.30 cm MV Decel Time: 166 msec MV E velocity: 90.15 cm/s Vina Gull MD Electronically signed by Vina Gull MD Signature Date/Time: 12/28/2023/1:56:43 PM    Final    VAS US  LOWER EXTREMITY VENOUS (DVT) Result Date: 12/28/2023  Lower Venous DVT Study Patient Name:  KYNSLEI ART  Date of Exam:   12/28/2023 Medical Rec #: [id number]         Accession #:    [id number] Date of Birth: 02-13-1946         Patient Gender: F Patient Age:   24 years Exam Location:  [facility] Procedure:      VAS US  LOWER EXTREMITY VENOUS (DVT) Referring Phys: WILL BERNARD --------------------------------------------------------------------------------  Indications: PE in history.  Risk Factors: Hx of PE. Anticoagulation: Heparin . Comparison Study: None. Performing Technologist: Garnette Rockers  Examination Guidelines: A complete evaluation includes B-mode imaging, spectral Doppler, color Doppler, and power Doppler as needed of all accessible portions of each vessel. Bilateral testing is considered an integral part of a complete examination. Limited examinations for reoccurring indications may be performed as noted. The reflux portion of the exam is performed with the patient in reverse Trendelenburg.  +---------+---------------+---------+-----------+----------+--------------+ RIGHT    CompressibilityPhasicitySpontaneityPropertiesThrombus Aging +---------+---------------+---------+-----------+----------+--------------+ CFV      Full           Yes      Yes                                  +---------+---------------+---------+-----------+----------+--------------+ SFJ      Full                                                        +---------+---------------+---------+-----------+----------+--------------+ FV Prox  Full                                                        +---------+---------------+---------+-----------+----------+--------------+ FV Mid   Full                                                        +---------+---------------+---------+-----------+----------+--------------+  FV DistalFull                                                        +---------+---------------+---------+-----------+----------+--------------+ PFV      Full                                                        +---------+---------------+---------+-----------+----------+--------------+ POP      Full           Yes      Yes                                 +---------+---------------+---------+-----------+----------+--------------+ PTV      Full                                                        +---------+---------------+---------+-----------+----------+--------------+ PERO     Full                                                        +---------+---------------+---------+-----------+----------+--------------+   +---------+---------------+---------+-----------+----------+--------------+ LEFT     CompressibilityPhasicitySpontaneityPropertiesThrombus Aging +---------+---------------+---------+-----------+----------+--------------+ CFV      Full           Yes      Yes                                 +---------+---------------+---------+-----------+----------+--------------+ SFJ      Full                                                        +---------+---------------+---------+-----------+----------+--------------+ FV Prox  Full                                                         +---------+---------------+---------+-----------+----------+--------------+ FV Mid   Full                                                        +---------+---------------+---------+-----------+----------+--------------+ FV DistalFull                                                        +---------+---------------+---------+-----------+----------+--------------+  PFV      Full                                                        +---------+---------------+---------+-----------+----------+--------------+ POP      Full           Yes      Yes                                 +---------+---------------+---------+-----------+----------+--------------+ PTV      Full                                                        +---------+---------------+---------+-----------+----------+--------------+ PERO     Full                                                        +---------+---------------+---------+-----------+----------+--------------+    Summary: BILATERAL: - No evidence of deep vein thrombosis seen in the lower extremities, bilaterally. -No evidence of popliteal cyst, bilaterally.   *See table(s) above for measurements and observations.    Preliminary    CT Angio Chest PE W and/or Wo Contrast Result Date: 12/27/2023 CLINICAL DATA:  Shortness of breath. High probability pulmonary embolism. EXAM: CT ANGIOGRAPHY CHEST WITH CONTRAST TECHNIQUE: Multidetector CT imaging of the chest was performed using the standard protocol during bolus administration of intravenous contrast. Multiplanar CT image reconstructions and MIPs were obtained to evaluate the vascular anatomy. RADIATION DOSE REDUCTION: This exam was performed according to the departmental dose-optimization program which includes automated exposure control, adjustment of the mA and/or kV according to patient size and/or use of iterative reconstruction technique. CONTRAST:  75mL OMNIPAQUE  IOHEXOL  350 MG/ML SOLN COMPARISON:   None Available. FINDINGS: Cardiovascular: Single site of pulmonary embolism is seen in a subsegmental posterior right lower lobe pulmonary artery branch (e.g. Image 276/5). No other sites of pulmonary embolism identified. Thoracic aorta is not well opacified by contrast, however, there is no evidence of thoracic aortic aneurysm. Mediastinum/Nodes: No masses or pathologically enlarged lymph nodes identified. Lungs/Pleura: No pulmonary mass, infiltrate, or effusion. Upper abdomen: No acute findings. Musculoskeletal: No suspicious bone lesions identified. Review of the MIP images confirms the above findings. IMPRESSION: Single site of pulmonary embolism in a posterior subsegmental branch of the right lower lobe pulmonary artery. Critical Value/emergent results were called by telephone at the time of interpretation on 12/27/2023 at 1:55 pm to provider COOPER ROBBINS , who verbally acknowledged these results. Electronically Signed   By: Norleen DELENA Kil M.D.   On: 12/27/2023 14:06   DG Chest Port 1 View Result Date: 12/27/2023 CLINICAL DATA:  Short of breath EXAM: PORTABLE CHEST 1 VIEW COMPARISON:  12/05/2023 FINDINGS: Normal mediastinum and cardiac silhouette. Normal pulmonary vasculature. No evidence of effusion, infiltrate, or pneumothorax. No acute bony abnormality. IMPRESSION: No active disease. Electronically Signed   By: Jackquline Boxer M.D.   On: 12/27/2023 12:37     Assessment and  Plan:   1. SOB, acute PE, dental implant concerns - anticoag per primary team - will ultimately need PCP clearance to come off anticoag if dental implants removed; this may also be impacted by plan for #2 but Afib is known to be paroxysmal (seemed to be evidence of this caught on partial EKG in 11/2023 as well)  2. Known paroxysmal atrial fibrillation s/p ablation 2020, here with evidence of recurrent Afib + Atrial flutter RVR - followed by [facility], with concerns for recurrence by recent notes, felt to be poor Tikosyn candidate  due to lack of anticoagulation - seemed to be evidence of this caught on partial EKG in 11/2023 as well - seen for palpitations - patient now anticoagulated in setting of above - HR 120s presently, BP 134/81 - will give 5mg  IV Lopressor  and start Lopressor  orally 25mg  BID - echo reassuring - will discuss further with Dr. Mona  3. Essential HTN - follow in context above  4. Mild mitral regurgitation - consider repeat echo 3-5 years with primary cardiologist  5. Mild anemia - appears chronic by limited Epic data, here at 10.9 today, previously 12.5 in 04/2023 - per IM  6. Hypokalemia - per primary team - recommend keep K 4.0 or greater (Mg OK)  Risk Assessment/Risk Scores:       CHA2DS2-VASc Score = 6   This indicates a 9.7% annual risk of stroke. The patient's score is based upon: CHF History: 0 HTN History: 1 Diabetes History: 0 Stroke History: 2 (thromboembolism (PE)) Vascular Disease History: 0 Age Score: 2 Gender Score: 1     For questions or updates, please contact [facility] Please consult [email or web address] for contact info under    Signed, Maksymilian Mabey N Ernestine Rohman, PA-C  12/29/2023 9:35 AM

## 2023-12-29 NOTE — Hospital Course (Addendum)
 78 y.o. 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  In the ED patient was noted to be interactive.  Atrial fibrillation.  CT angiogram of the chest showed acute pulmonary embolism and received IV beta-blocker and heparin  infusion.  Patient was then admitted hospital for further evaluation and treatment.   Assessment and plan.  Acute pulmonary embolism without acute cor pulmonale.  On Eliquis  at this time from initial IV heparin  infusion..  Patient had A-fib with elevated CHA2DS2-VASc score so we will will need lifelong anticoagulation.  Atrial fibrillation with RVR.  Used to follow-up with [facility] cardiology.  Status post cryo ablation 12/2018.  On normal sinus rhythm.  Continue metoprolol .  Would benefit from cardiology follow-up as outpatient.  Essential hypertension.  Continue metoprolol .  GERD.  Continue PPI.  Vitamin B12 deficiency.  Continue monthly vitamin B12 injections.  Grief from recent family member loss.  Patient awaiting to make arrangements for her husband's cremation.

## 2023-12-29 NOTE — Assessment & Plan Note (Signed)
 Longstanding history of paroxysmal atrial fibrillation, previously used to follow with [facility] cardiology Status post cryoablation 12/2018 Back in normal sinus rhythm Placing patient on metoprolol  tartrate 25 mg twice daily Would benefit from getting established with cardiology after discharge.

## 2023-12-29 NOTE — Assessment & Plan Note (Signed)
 Metoprolol As needed intravenous antihypertensives for markedly elevated blood pressure.

## 2023-12-29 NOTE — Progress Notes (Signed)
 Patient received discharge orders to go home. Patient was given discharge paperwork/instructions. RN went over discharge instructions/paperwork with the patient. All questions/concerns were answered/addressed to the best of the RN's ability. Patient was not able to pick up the discharge prescription medications [facility] [address] [facility] [place]. RN notified MD to see if MD could send the prescriptions to [facility] (Meds-to-Beds). MD sent the prescriptions to [facility] (Meds-to-Beds). Pharmacy brought the prescription medications they were able to fill (Eliquis  and metoprolol ) to the patient's bedside prior to discharge. RN told patient that the [facility] was not able to fill the benzocaine  since the pharmacy does not stock it. Patient left the hospital stable, had discharge paperwork/instructions, had medications, and had all personal belongings.

## 2023-12-29 NOTE — TOC Initial Note (Signed)
 Transition of Care [facility]) - Initial/Assessment Note   Patient Details  Name: Anita Dickerson MRN: [id number] Date of Birth: 1946/07/22  [facility]) CM/SW Contact:    Duwaine GORMAN [name], LCSW Phone Number: 12/29/2023, 1:17 PM  Clinical Narrative: [facility] consulted as patient reported she has concerns regarding paying bills for housing and utilities since her spouse recently passed. CSW met with patient to discuss consult and patient is agreeable to having rental and utilities assistance resources added to AVS. CSW added rental/utilities assistance resources to AVS.  Expected Discharge Plan: Home/Self Care Barriers to Discharge: Continued Medical Work up  Patient Goals and CMS Choice Patient states their goals for this hospitalization and ongoing recovery are:: Get assistance with utilities and housing bills Choice offered to / list presented to : NA  Expected Discharge Plan and Services In-house Referral: Clinical Social Work Discharge Planning Services: NA Post Acute Care Choice: NA Living arrangements for the past 2 months: Single Family Home           DME Arranged: N/A DME Agency: NA  Prior Living Arrangements/Services Living arrangements for the past 2 months: Single Family Home Lives with:: Self Patient language and need for interpreter reviewed:: Yes Do you feel safe going back to the place where you live?: Yes      Need for Family Participation in Patient Care: No (Comment) Care giver support system in place?: Yes (comment) Criminal Activity/Legal Involvement Pertinent to Current Situation/Hospitalization: No - Comment as needed  Activities of Daily Living ADL Screening (condition at time of admission) Independently performs ADLs?: Yes (appropriate for developmental age) Is the patient deaf or have difficulty hearing?: No Does the patient have difficulty seeing, even when wearing glasses/contacts?: No Does the patient have difficulty concentrating, remembering, or  making decisions?: No  Emotional Assessment Appearance:: Appears stated age Attitude/Demeanor/Rapport: Engaged Affect (typically observed): Accepting Orientation: : Oriented to Self, Oriented to Place, Oriented to  Time, Oriented to Situation Alcohol / Substance Use: Not Applicable Psych Involvement: No (comment)  Admission diagnosis:  Pulmonary embolus (HCC) [I26.99] Other acute pulmonary embolism without acute cor pulmonale (HCC) [I26.99] Patient Active Problem List   Diagnosis Date Noted   Paroxysmal atrial fibrillation with rapid ventricular response (HCC) 12/29/2023   Essential hypertension 12/29/2023   GERD without esophagitis 12/29/2023   Grieving 12/29/2023   Acute pulmonary embolism without acute cor pulmonale (HCC) 12/27/2023   B12 deficiency 01/08/2017   Depression and anxiety - sees psych 02/13/2015   PCP:  Franchot Charlies GRADE, MD [phone] [place], [place] - [address] [place] [address] Phone: [phone] Fax: [phone]  Social Drivers of Health (SDOH) Social History: SDOH Screenings   Food Insecurity: No Food Insecurity (12/27/2023)  Housing: High Risk (12/27/2023)  Transportation Needs: No Transportation Needs (12/27/2023)  Utilities: Not At Risk (12/27/2023)  Financial Resource Strain: Low Risk  (07/17/2022)   [facility] from [facility]  Social Connections: Socially Isolated (12/27/2023)  Tobacco Use: Low Risk  (12/27/2023)   SDOH Interventions:    Readmission Risk Interventions     No data to display

## 2023-12-29 NOTE — Assessment & Plan Note (Signed)
 Patient reports recent death of her husband and is actively grieving Patient reports after discharge tomorrow she will have to make arrangements for her husband's cremation. Providing emotional support

## 2023-12-29 NOTE — Progress Notes (Signed)
   12/29/23 1400  Spiritual Encounters  Type of Visit Initial  Care provided to: Patient  Conversation partners present during encounter Physician  Referral source Chaplain assessment  Reason for visit Urgent spiritual support  OnCall Visit No  Spiritual Framework  Presenting Themes Meaning/purpose/sources of inspiration;Goals in life/care;Values and beliefs;Significant life change;Coping tools  Community/Connection Family;Friend(s);Faith community  Patient Stress Factors Financial concerns;Health changes;Loss;Major life changes  Family Stress Factors None identified  Interventions  Spiritual Care Interventions Made Established relationship of care and support;Normalization of emotions;Compassionate presence;Reflective listening;Reconciliation with self/others;Narrative/life review;Explored values/beliefs/practices/strengths;Meaning making;Bereavement/grief support;Prayer  Intervention Outcomes  Outcomes Reduced isolation;Reduced fear;Reduced anxiety;Awareness of support;Awareness of health;Connection to spiritual care   Met with patient at bedside while MD was consulting with patient. MD and patient invited me to stay.  Met with pt alone and she shared her life narrative and that her husband - Claudius- just passed away and today was a highly emotional day for her.  She was having her husband body cremated today.  She spoke of her long standing love for her husband and his career in the music induct ry.  She spoke of her challenges that she will face when going home with out hi,. Financial concerns she expressed.  She spoke of her health and its challenges and her dealing with grief.  Chaplain addressed her bereavement stage of her self care at this time.  Follow up support could be beneficial to continue to address her Grief and Bereavement   Respectfully Submitted - Rev. Mallie Dennis Molt

## 2023-12-29 NOTE — Telephone Encounter (Addendum)
 Patient Product/process Development Scientist completed.    The patient is insured through [employer].     Ran test claim for Eliquis  5 mg and the current 30 day co-pay is $146.87 due to a $50.00 deductible.  Ran test claim for Xarelto 20 mg and the current 30 day co-pay is $140.25 due to a $50.00 deductible.  This test claim was processed through [facility]- copay amounts may vary at other pharmacies due to pharmacy/plan contracts, or as the patient moves through the different stages of their insurance plan.     Reyes Sharps, CPHT Pharmacy Technician III Certified Patient Advocate [facility] Patient Advocate Team Direct Number: [phone]  Fax: [phone]

## 2023-12-29 NOTE — Progress Notes (Signed)
 PROGRESS NOTE   Anita Dickerson  FMW:[id number] DOB: January 09, 1946 DOA: 12/27/2023 PCP: Franchot Charlies GRADE, MD   Date of Service: the patient was seen and examined on 12/28/2023  Brief Narrative:  78 y.o. 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  Upon evaluation in the emergency department patient was noted to be in rapid atrial fibrillation.  CT angiogram of the chest was performed revealing an acute pulmonary embolism of the right lower lobe.  Patient received a dose of intravenous beta-blocker with conversion back to normal sinus rhythm.  Patient is initiated on heparin  infusion.  The hospitalist group was then called to assess the patient for admission the hospital.   Assessment & Plan Acute pulmonary embolism without acute cor pulmonale (HCC) On heparin  infusion, will transition to Eliquis   Will monitor overnight on Eliquis  and if tolerated well can be discharged tomorrow. Since patient has atrial fibrillation anyway with an elevated Chads Vasc score will need lifelong therapy.   No evidence of right heart strain on echocardiogram  concerning the causation, patient reports multiple recent long car trips and a recent fall approximately 1 week ago resulting in him significant bruising to her right flank.  Patient reports regular mammograms all of which were unremarkable and an unremarkable colonoscopy in the last 10 years.  Patient denies recent unintentional weight loss. Paroxysmal atrial fibrillation with rapid ventricular response (HCC) Longstanding history of paroxysmal atrial fibrillation, previously used to follow with [facility] cardiology Status post cryoablation 12/2018 Back  in normal sinus rhythm Placing patient on metoprolol  tartrate 25 mg twice daily Would benefit from getting established with cardiology after discharge. Essential hypertension Metoprolol  As needed intravenous antihypertensives for markedly elevated blood pressure. GERD without esophagitis Continue home regimen of PPI B12 deficiency Monthly B12 injections as an outpatient. Grieving Patient reports recent death of her husband and is actively grieving Patient reports after discharge tomorrow she will have to make arrangements for her husband's cremation. Providing emotional support     Subjective:  Patient denies chest pain.  Generalized malaise and shortness of breath have improved.  Physical Exam:  Vitals:   12/27/23 2049 12/28/23 0627 12/28/23 1100 12/28/23 1950  BP: (!) 143/96 118/68 119/82 106/83  Pulse: 98 84 80 95  Resp: 20 18 17 17   Temp: 98.3 F (36.8 C) 98.4 F (36.9 C) 97.9 F (36.6 C) 98.2 F (36.8 C)  TempSrc: Oral Oral Oral Oral  SpO2: 99% 100% 97% 100%  Weight:      Height:        Constitutional: Awake alert and oriented x3, no associated distress.   Skin: no rashes, no lesions, good skin turgor noted. Eyes: Pupils are equally reactive to light.  No evidence of scleral icterus or conjunctival pallor.  ENMT: Moist mucous membranes noted.  Posterior pharynx clear of any exudate or lesions.   Respiratory: clear to auscultation bilaterally, no wheezing, no crackles. Normal respiratory effort. No accessory muscle use.  Cardiovascular: Regular rate and rhythm, no murmurs / rubs / gallops. No extremity edema. 2+ pedal pulses. No carotid bruits.  Abdomen: Abdomen is soft and nontender.  No evidence of intra-abdominal masses.  Positive bowel sounds noted in all quadrants.   Musculoskeletal: No joint deformity upper and lower extremities. Good ROM, no contractures.  Normal muscle tone.    Data Reviewed:  I have personally reviewed and interpreted labs,  imaging.  Significant findings are   CBC: Recent Labs  Lab 12/27/23 1209 12/27/23 2338  WBC 5.3 6.2  HGB 12.0 11.1*  HCT 35.4* 34.4*  MCV 89.4 93.5  PLT 218 215   Basic Metabolic Panel: Recent Labs  Lab 12/27/23 1209 12/27/23 2338  NA 140 139  K 3.6 3.3*  CL 100 104  CO2 30 27  GLUCOSE 92 103*  BUN 8 8  CREATININE 0.98 0.58  CALCIUM 9.2 8.2*    Telemetry: Personally reviewed.  Rhythm is atrial fibrillation with heart rate of 100 bpm.  No dynamic ST segment changes appreciated.   Code Status:  Full code.  Code status decision has been confirmed with: patient    Severity of Illness:  The appropriate patient status for this patient is INPATIENT. Inpatient status is judged to be reasonable and necessary in order to provide the required intensity of service to ensure the patient's safety. The patient's presenting symptoms, physical exam findings, and initial radiographic and laboratory data in the context of their chronic comorbidities is felt to place them at high risk for further clinical deterioration. Furthermore, it is not anticipated that the patient will be medically stable for discharge from the hospital within 2 midnights of admission.   * I certify that at the point of admission it is my clinical judgment that the patient will require inpatient hospital care spanning beyond 2 midnights from the point of admission due to high intensity of service, high risk for further deterioration and high frequency of surveillance required.*  Time spent:  50 minutes  Author:  Zachary JINNY Ba MD

## 2023-12-29 NOTE — Discharge Instructions (Signed)
 Information on my medicine - ELIQUIS  (apixaban )  This medication education was reviewed with me or my healthcare representative as part of my discharge preparation.  Why was Eliquis  prescribed for you? Eliquis  was prescribed to treat blood clots that may have been found in the veins of your legs (deep vein thrombosis) or in your lungs (pulmonary embolism) and to reduce the risk of them occurring again.  What do You need to know about Eliquis  ? The starting dose is 10 mg (two 5 mg tablets) taken TWICE daily for the FIRST SEVEN (7) DAYS, then on 01/04/24  the dose is reduced to ONE 5 mg tablet taken TWICE daily.  Eliquis  may be taken with or without food.   Try to take the dose about the same time in the morning and in the evening. If you have difficulty swallowing the tablet whole please discuss with your pharmacist how to take the medication safely.  Take Eliquis  exactly as prescribed and DO NOT stop taking Eliquis  without talking to the doctor who prescribed the medication.  Stopping may increase your risk of developing a new blood clot.  Refill your prescription before you run out.  After discharge, you should have regular check-up appointments with your healthcare provider that is prescribing your Eliquis .    What do you do if you miss a dose? If a dose of ELIQUIS  is not taken at the scheduled time, take it as soon as possible on the same day and twice-daily administration should be resumed. The dose should not be doubled to make up for a missed dose.  Important Safety Information A possible side effect of Eliquis  is bleeding. You should call your healthcare provider right away if you experience any of the following: Bleeding from an injury or your nose that does not stop. Unusual colored urine (red or dark brown) or unusual colored stools (red or black). Unusual bruising for unknown reasons. A serious fall or if you hit your head (even if there is no bleeding).  Some medicines  may interact with Eliquis  and might increase your risk of bleeding or clotting while on Eliquis . To help avoid this, consult your healthcare provider or pharmacist prior to using any new prescription or non-prescription medications, including herbals, vitamins, non-steroidal anti-inflammatory drugs (NSAIDs) and supplements.  This website has more information on Eliquis  (apixaban ): http://www.eliquis .com/eliquis dena

## 2023-12-29 NOTE — Assessment & Plan Note (Signed)
   Continue home regimen of PPI 

## 2023-12-30 ENCOUNTER — Telehealth: Payer: Self-pay | Admitting: Physician Assistant

## 2023-12-30 NOTE — Telephone Encounter (Signed)
 Left message to call back

## 2023-12-30 NOTE — Telephone Encounter (Signed)
 Pt c/o medication issue:  1. Name of Medication: apixaban  (ELIQUIS ) 5 MG TABS tablet   2. How are you currently taking this medication (dosage and times per day)?  Take 2 tablets (10 mg total) by mouth 2 (two) times daily for 6 days, THEN 1 tablet (5 mg total) 2 (two) times daily.       3. Are you having a reaction (difficulty breathing--STAT)? No  4. What is your medication issue? Pt is requesting a callback regarding her being allergic to this medication but they gave it to her in the hospital and now she'd like to be advised on what to do. Please advise

## 2023-12-30 NOTE — Telephone Encounter (Signed)
 Called patient with no answer. Left message to return call.  Patient need to contact PCP concerning Eliquis.

## 2023-12-30 NOTE — Telephone Encounter (Signed)
 Called and spoke to patient. Verified name and DOB.Patient reports she was recently in the hospital at [facility] for SOB and palpitations. It was found that patient had a blood clot in fer lung. They could not determine if it was due to a fall she had or something else per the patient. She states she is allergic to Eliquis  and was started on it in the hospital. Patient states she is informed the nurse prior to her giving it to her and she gave it to her any way. She report she is having severe abdominal pain,burning in her face, eyes and nostrils. She also states her face feels puffy. She deny SOB, tightness in her throat or tongue swelling.These are the same symptoms when that she had before. Patient reports having a hemorrhagin disorder. She stated she was told not to take Xarelto either but could not remember why. She has not been seen in the office yet but is scheduled for a hospital follow up with Callie Goodrich, PA on 2/3. Please advised.

## 2023-12-30 NOTE — Telephone Encounter (Signed)
 I have never seen this patient. We typically defer anticoagulation recommendations to PCP when it is for an PE or DVT. However, it looks like she also has a history of atrial fibrillation. I will route this to Dr. Mona who saw patient in the hospital. However, would also recommend she call her PCP ASAP and discuss this with them given acute PE.  Thank you!

## 2023-12-30 NOTE — Telephone Encounter (Signed)
 Left message for patient to return the call.

## 2023-12-31 NOTE — Telephone Encounter (Signed)
 Spoke to pt in lengthy detail about her problems.  She states she is currently in between care and does not know if she will end up in [place] or [place] and because of this is trying to establish care in [place].  She saw a PCP on 12/30/2023 at [facility].  She has another appt on 01/01/24 at 3pm.  Her symptoms have improved but she was given ER precautions and verbalized understanding.  She states she has had problems with dental work/implants/metal that she needs removed but this can not be done right now, she has to wait 2-3 months.  She also has reached out to [facility] where she has seen a Cardiologist in the past.    For now moved her appt from 01/19/24 to 01/08/24 with APP.  She has no family and husband of 53 years passed away 12/31/23.  Pt tearful and worried, appreciative for our concern regarding her care.

## 2024-01-01 LAB — CULTURE, BLOOD (ROUTINE X 2)
Culture: NO GROWTH
Culture: NO GROWTH
Special Requests: ADEQUATE
Special Requests: ADEQUATE

## 2024-01-08 ENCOUNTER — Encounter: Payer: Self-pay | Admitting: Nurse Practitioner

## 2024-01-08 ENCOUNTER — Ambulatory Visit: Payer: Medicare Other | Attending: Nurse Practitioner | Admitting: Nurse Practitioner

## 2024-01-08 VITALS — BP 106/68 | HR 119 | Ht 67.0 in | Wt 131.0 lb

## 2024-01-08 DIAGNOSIS — F4321 Adjustment disorder with depressed mood: Secondary | ICD-10-CM | POA: Insufficient documentation

## 2024-01-08 DIAGNOSIS — D6859 Other primary thrombophilia: Secondary | ICD-10-CM | POA: Diagnosis present

## 2024-01-08 DIAGNOSIS — Z86711 Personal history of pulmonary embolism: Secondary | ICD-10-CM | POA: Insufficient documentation

## 2024-01-08 DIAGNOSIS — I480 Paroxysmal atrial fibrillation: Secondary | ICD-10-CM | POA: Insufficient documentation

## 2024-01-08 DIAGNOSIS — I10 Essential (primary) hypertension: Secondary | ICD-10-CM | POA: Insufficient documentation

## 2024-01-08 NOTE — Patient Instructions (Signed)
Medication Instructions:  Your physician recommends that you continue on your current medications as directed. Please refer to the Current Medication list given to you today.  *If you need a refill on your cardiac medications before your next appointment, please call your pharmacy*   Lab Work: CBC, BMET, Magnesium today   Testing/Procedures: NONE ordered at this time of appointment     Follow-Up: At [facility], you and your health needs are our priority.  As part of our continuing mission to provide you with exceptional heart care, we have created designated Provider Care Teams.  These Care Teams include your primary Cardiologist (physician) and Advanced Practice Providers (APPs -  Physician Assistants and Nurse Practitioners) who all work together to provide you with the care you need, when you need it.  We recommend signing up for the patient portal called "MyChart".  Sign up information is provided on this After Visit Summary.  MyChart is used to connect with patients for Virtual Visits (Telemedicine).  Patients are able to view lab/test results, encounter notes, upcoming appointments, etc.  Non-urgent messages can be sent to your provider as well.   To learn more about what you can do with MyChart, go to [email or web address].    Your next appointment:    Next available   Provider:   Chrystie Nose, MD     Other Instructions Monitor Heart rate. Report resting heart rate consistently greater than 110.

## 2024-01-08 NOTE — Progress Notes (Signed)
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   Vitamin B 12 deficiency    Past Surgical History:  Procedure Laterality Date   COLON SURGERY     HAND SURGERY     MYOMECTOMY  1997-1998   SMALL INTESTINE SURGERY     ileum/jej for sbo   TOE SURGERY      Allergies  Allergies  Allergen Reactions   Tetanus Toxoids Shortness Of Breath and Rash   Nicotine Itching    Reaction Type: Allergy   Diazepam Other (See Comments) and Rash    Reaction Type: Allergy; Reaction(s): Visual hallucinations,unknown     Labs/Other Studies Reviewed    The following studies were reviewed today:  Cardiac Studies & Procedures      ECHOCARDIOGRAM  ECHOCARDIOGRAM COMPLETE 12/28/2023  Narrative ECHOCARDIOGRAM REPORT    Patient Name:   Anita Dickerson Date of Exam: 12/28/2023 Medical Rec #:  [id number]        Height:       67.0 in Accession #:    [id number]       Weight:       135.8 lb Date of Birth:  1946-12-10        BSA:          1.715 m Patient Age:    77 years         BP:           118/68 mmHg Patient Gender: F                HR:           98 bpm. Exam Location:  Inpatient  Procedure: 2D Echo, Cardiac Doppler and Color  Doppler  Indications:    R06.02 SOB  History:        Patient has no prior history of Echocardiogram examinations. Abnormal ECG; Arrythmias:Atrial Fibrillation. Pulmonary embolus. Ablation.  Sonographer:    Sheralyn Boatman RDCS Referring Phys: [id number] CLAUDIA CLAIBORNE  IMPRESSIONS   1. Left ventricular ejection fraction, by estimation, is 60 to 65%. The left ventricle has normal function. The left ventricle has no regional wall motion abnormalities. Left ventricular diastolic parameters are indeterminate. 2. Right ventricular systolic function is low normal. The right ventricular size is mildly enlarged. There is normal pulmonary artery systolic pressure. 3. The mitral valve is normal in structure. Mild mitral valve regurgitation. 4. The aortic valve is tricuspid. Aortic valve regurgitation is not visualized.  FINDINGS Left Ventricle: Left ventricular ejection fraction, by estimation, is 60 to 65%. The left ventricle has normal function. The left ventricle has no regional wall motion abnormalities. The left ventricular internal cavity size was normal in size. There is no left ventricular hypertrophy. Left ventricular diastolic parameters  are indeterminate.  Right Ventricle: The right ventricular size is mildly enlarged. Right vetricular wall thickness was not assessed. Right ventricular systolic function is low normal. There is normal pulmonary artery systolic pressure. The tricuspid regurgitant velocity is 2.17 m/s, and with an assumed right atrial pressure of 3 mmHg, the estimated right ventricular systolic pressure is 21.8 mmHg.  Left Atrium: Left atrial size was normal in size.  Right Atrium: Right atrial size was normal in size.  Pericardium: There is no evidence of pericardial effusion.  Mitral Valve: The mitral valve is normal in structure. Mild mitral valve regurgitation. MV peak gradient, 8.8 mmHg. The mean mitral valve gradient is 2.0 mmHg.  Tricuspid Valve: The tricuspid valve is  normal in structure. Tricuspid valve regurgitation is mild.  Aortic Valve: The aortic valve is tricuspid. Aortic valve regurgitation is not visualized.  Pulmonic Valve: The pulmonic valve was normal in structure. Pulmonic valve regurgitation is mild to moderate.  Aorta: The aortic root and ascending aorta are structurally normal, with no evidence of dilitation.  IAS/Shunts: No atrial level shunt detected by color flow Doppler.   LEFT VENTRICLE PLAX 2D LVIDd:         3.40 cm LVIDs:         2.40 cm LV PW:         2.10 cm LV IVS:        1.00 cm LVOT diam:     2.30 cm LV SV:         66 LV SV Index:   38 LVOT Area:     4.15 cm  LV Volumes (MOD) LV vol d, MOD A2C: 54.7 ml LV vol d, MOD A4C: 41.5 ml LV vol s, MOD A2C: 18.0 ml LV vol s, MOD A4C: 22.5 ml LV SV MOD A2C:     36.7 ml LV SV MOD A4C:     41.5 ml LV SV MOD BP:      31.2 ml  RIGHT VENTRICLE             IVC RV S prime:     11.60 cm/s  IVC diam: 2.10 cm TAPSE (M-mode): 1.2 cm  LEFT ATRIUM             Index        RIGHT ATRIUM           Index LA diam:        3.30 cm 1.92 cm/m   RA Area:     15.00 cm LA Vol (A2C):   25.1 ml 14.63 ml/m  RA Volume:   36.90 ml  21.51 ml/m LA Vol (A4C):   38.3 ml 22.33 ml/m LA Biplane Vol: 32.0 ml 18.65 ml/m AORTIC VALVE             PULMONIC VALVE LVOT Vmax:   83.60 cm/s  PR End Diast Vel: 1.49 msec LVOT Vmean:  53.600 cm/s LVOT VTI:    0.158 m  AORTA Ao Root diam: 3.20 cm Ao Asc diam:  3.30 cm  MITRAL VALVE               TRICUSPID VALVE MV Area (PHT): 4.58 cm    TR Peak grad:   18.8 mmHg MV Area VTI:   2.59 cm    TR Vmax:        217.00 cm/s MV Peak grad:  8.8 mmHg MV Mean grad:  2.0 mmHg    SHUNTS MV Vmax:       1.48 m/s  Systemic VTI:  0.16 m MV Vmean:      66.4 cm/s   Systemic Diam: 2.30 cm MV Decel Time: 166 msec MV E velocity: 90.15 cm/s  Dietrich Pates MD Electronically signed by Dietrich Pates MD Signature Date/Time: 12/28/2023/1:56:43 PM    Final             Recent Labs: 12/27/2023: B Natriuretic Peptide 219.3; TSH 0.406 12/29/2023: ALT 16; BUN 15; Creatinine, Ser 0.87; Hemoglobin 10.9; Magnesium 2.1; Platelets 211; Potassium 3.0; Sodium 137  Recent Lipid Panel    Component Value Date/Time   CHOL 192 02/13/2015 1055   TRIG 49.0 02/13/2015 1055   HDL 83.20 02/13/2015 1055   CHOLHDL 2 02/13/2015 1055   VLDL 9.8 02/13/2015 1055   LDLCALC 99 02/13/2015 1055    History of Present Illness    78 year old female with the above past medical history including paroxysmal atrial fibrillation s/p ablation in 2020 at outside facility, SVT, PACs, PE, hypertension, mild carotid artery stenosis, Paget's disease, DCIS s/p segmental mastectomy, 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  She has followed with [facility] cardiology/EP team for management of atrial fibrillation, PACs, PSVT.  She is s/p ablation in 2020.  Cardiac monitor in 2023 in the setting of palpitations showed sinus rhythm with with 7.7% PACs.  She declined Tikosyn, declined anticoagulation.  Echocardiogram in 2023 showed EF 65 to 70%, moderate MR, mild pulmonary hypertension, mild to moderate TR.  Remote cardiac MRI in 2020 showed no LGE.  She was hospitalized from 12/27/2023 to 12/29/2023 in the setting of shortness of breath.  CTA chest showed acute RLL PE, venous duplex was negative for DVT.  EKG showed atrial fibrillation with RVR.  Cardiology was consulted in this setting.  Echocardiogram showed EF 60 to 65%, low normal RV function, mildly enlarged RV, normal PASP, mild MR.  She was started on Eliquis and metoprolol.  It was noted that should she remain in atrial fibrillation, outpatient DCCV could be considered.  She contacted our office on 12/30/2023 with concern for allergic reaction to Eliquis.  She reported abdominal pain, burning in her face, eyes, and nostrils.  She also noted an intolerance to Xarelto.  It was noted that should she have ongoing symptoms,  ED evaluation would be advised.  She presents today for follow-up.  Since her hospitalization she has been stable overall though she continues to feel poorly.  She has low energy, decreased appetite, she has not been sleeping well.  She notes some mild dyspnea with exertion, denies chest pain, palpitations, dizziness, edema, PND, orthopnea, weight gain.  Reports adherence to Eliquis and denies any adverse reaction today.  She notes that she is grieving the recent death of her husband (he died in late Dec 29, 2024). She notes "I hope to go to sleep one day and not wake up."  She denies suicidal ideation.  Home Medications    Current Outpatient Medications  Medication Sig Dispense Refill   apixaban (ELIQUIS) 5 MG TABS tablet Take 2 tablets (10 mg total) by mouth 2 (two) times daily for 6 days, THEN 1 tablet (5 mg total) 2 (two) times daily. 204 tablet 0   Ascorbic Acid (VITAMIN C PO) Take 1 tablet by mouth daily.     benzocaine (ORAJEL) 10 % mucosal gel Use as directed in the mouth or throat 2 (two) times daily as needed for mouth pain. 5.3 g 0   cyanocobalamin (VITAMIN B12) 1000 MCG/ML injection Inject 1,000 mcg into the  muscle every 30 (thirty) days.     cyclobenzaprine (FLEXERIL) 5 MG tablet Take 5 mg by mouth at bedtime as needed for muscle spasms.     ergocalciferol (VITAMIN D2) 1.25 MG (50000 UT) capsule Take 1 capsule by mouth every 7 (seven) days.     feeding supplement (ENSURE ENLIVE / ENSURE PLUS) LIQD Take 237 mLs by mouth 2 (two) times daily between meals.     hydrochlorothiazide (HYDRODIURIL) 25 MG tablet Take 1 tablet by mouth daily.     MAGNESIUM PO Take 1 tablet by mouth daily.     metoprolol tartrate (LOPRESSOR) 25 MG tablet Take 1 tablet (25 mg total) by mouth 2 (two) times daily. 60 tablet 0   Multiple Vitamins-Minerals (ONE DAILY CALCIUM/IRON) TABS Take 1 capsule by mouth daily.     potassium chloride (KLOR-CON) 10 MEQ tablet Take 20 mEq by mouth daily.     No current  facility-administered medications for this visit.     Review of Systems    She denies chest pain, palpitations, pnd, orthopnea, n, v, dizziness, syncope, edema, weight gain, or early satiety. All other systems reviewed and are otherwise negative except as noted above.   Physical Exam    VS:  BP 106/68 (BP Location: Left Arm, Patient Position: Sitting, Cuff Size: Normal)   Pulse (!) 119   Ht 5\' 7"  (1.702 m)   Wt 131 lb (59.4 kg)   SpO2 99%   BMI 20.52 kg/m   GEN: Well nourished, well developed, in no acute distress. HEENT: normal. Neck: Supple, no JVD, carotid bruits, or masses. Cardiac: IRIR, no murmurs, rubs, or gallops. No clubbing, cyanosis, edema.  Radials/DP/PT 2+ and equal bilaterally.  Respiratory:  Respirations regular and unlabored, clear to auscultation bilaterally. GI: Soft, nontender, nondistended, BS + x 4. MS: no deformity or atrophy. Skin: warm and dry, no rash. Neuro:  Strength and sensation are intact. Psych: Normal affect.  Accessory Clinical Findings    ECG personally reviewed by me today -    - no acute changes.   Lab Results  Component Value Date   WBC 4.7 12/29/2023   HGB 10.9 (L) 12/29/2023   HCT 32.9 (L) 12/29/2023   MCV 90.9 12/29/2023   PLT 211 12/29/2023   Lab Results  Component Value Date   CREATININE 0.87 12/29/2023   BUN 15 12/29/2023   NA 137 12/29/2023   K 3.0 (L) 12/29/2023   CL 102 12/29/2023   CO2 27 12/29/2023   Lab Results  Component Value Date   ALT 16 12/29/2023   AST 18 12/29/2023   ALKPHOS 47 12/29/2023   BILITOT 0.5 12/29/2023   Lab Results  Component Value Date   CHOL 192 02/13/2015   HDL 83.20 02/13/2015   LDLCALC 99 02/13/2015   TRIG 49.0 02/13/2015   CHOLHDL 2 02/13/2015    Lab Results  Component Value Date   HGBA1C 5.9 02/13/2015    Assessment & Plan    1. Paroxysmal atrial fibrillation: Longstanding history of atrial fibrillation, s/p ablation in 2020, she has followed with [facility] cardiology/EP.  He  declined anticoagulation.  Recent hospitalization in the setting of recurrent atrial fibrillation/PE.  She was started on Eliquis.  EKG today shows atrial fibrillation with RVR.  She has not taken her metoprolol today.  HR has been generally well-controlled at home.  Will check CBC, BMET, magnesium today.  Encouraged adherence to metoprolol, Eliquis.  Advised her to monitor heart rate and report heart rate consistently greater than  110 bpm.  Reviewed ED precautions.  Given longstanding history of atrial fibrillation, will refer to A-fib clinic for ongoing management.  She has been on anticoagulation for less than 3 weeks.  If she remains in atrial fibrillation following 3 weeks of anticoagulation, may need to consider DCCV.  For now, continue metoprolol, Eliquis.  2. History of PE: Recently diagnosed, likely occurred in the setting of atrial fibrillation.  She was started on Eliquis.  She did note concern for possible adverse reaction to Eliquis including symptoms of abdominal pain, burning in her face, eyes, and nostrils.  She denies any adverse reaction/symptoms today.  Continue Eliquis.  3. Hypertension: BP well controlled. Continue current antihypertensive regimen.   4. Anxiety/complicated grief: She notes that she is seeing a therapist.  She is experiencing what appears to be complicated grief.  She notes she has no children and very little family support.  Continue to monitor symptoms.  5. Disposition: Follow-up with A-fib clinic, follow-up with Dr. Rennis Golden next available.       Joylene Grapes, NP 01/08/2024, 3:47 PM

## 2024-01-09 ENCOUNTER — Telehealth (HOSPITAL_BASED_OUTPATIENT_CLINIC_OR_DEPARTMENT_OTHER): Payer: Self-pay

## 2024-01-09 DIAGNOSIS — E875 Hyperkalemia: Secondary | ICD-10-CM

## 2024-01-09 DIAGNOSIS — Z79899 Other long term (current) drug therapy: Secondary | ICD-10-CM

## 2024-01-09 LAB — CBC
Hematocrit: 38.6 % (ref 34.0–46.6)
Hemoglobin: 12.5 g/dL (ref 11.1–15.9)
MCH: 30.7 pg (ref 26.6–33.0)
MCHC: 32.4 g/dL (ref 31.5–35.7)
MCV: 95 fL (ref 79–97)
Platelets: 181 10*3/uL (ref 150–450)
RBC: 4.07 x10E6/uL (ref 3.77–5.28)
RDW: 13.9 % (ref 11.7–15.4)
WBC: 5.3 10*3/uL (ref 3.4–10.8)

## 2024-01-09 LAB — BASIC METABOLIC PANEL
BUN/Creatinine Ratio: 7 — ABNORMAL LOW (ref 12–28)
BUN: 8 mg/dL (ref 8–27)
CO2: 25 mmol/L (ref 20–29)
Calcium: 9.9 mg/dL (ref 8.7–10.3)
Chloride: 100 mmol/L (ref 96–106)
Creatinine, Ser: 1.1 mg/dL — ABNORMAL HIGH (ref 0.57–1.00)
Glucose: 97 mg/dL (ref 70–99)
Potassium: 5.4 mmol/L — ABNORMAL HIGH (ref 3.5–5.2)
Sodium: 140 mmol/L (ref 134–144)
eGFR: 51 mL/min/{1.73_m2} — ABNORMAL LOW (ref >59)

## 2024-01-09 LAB — MAGNESIUM: Magnesium: 2.4 mg/dL — ABNORMAL HIGH (ref 1.6–2.3)

## 2024-01-09 NOTE — Telephone Encounter (Signed)
Spoke with pt. Pt was notified of lab results and recommendations. Pt will stop potassium and magnesium supplements at this time, avoid electrolyte beverages and foods containing potassium. Pt will repeat labs in 1 week. Lab order placed.

## 2024-01-12 ENCOUNTER — Telehealth: Payer: Self-pay | Admitting: Internal Medicine

## 2024-01-12 NOTE — Telephone Encounter (Signed)
Joylene Grapes, NP  You5 minutes ago (4:38 PM)    Recommend she defer medication changes to her f/u appt with a fib clinic. Thank you -EM  Gave the patient the information above. Instructed her to monitor BP and to go slowly when making position changes. Make sure that you are staying well hydrated and eating enough.   She verbalized understanding.

## 2024-01-12 NOTE — Telephone Encounter (Signed)
BP 100/64 pulse 94 She reports that she is very tired all the time, wondering if her labs have something to do with this; or if her BP is a little low and is making her tired.   They increased her Metoprolol to 25 bid while in the hospital- before the hospital, she was taking 0.5 once a day. Wondering if this needs to be changed  (She does have an appt with Afib clinic 1/30- wondering if she can get the repeat labs drawn that day- told her that would be fine).    Informed her that I would send this information to her provider and call her back with recommendations.

## 2024-01-12 NOTE — Telephone Encounter (Signed)
Pt requesting cb to discuss fatigue x 2 days. Spoke with a nurse regarding lab results and wants to know if could be to related to her results

## 2024-01-14 ENCOUNTER — Telehealth: Payer: Self-pay | Admitting: Internal Medicine

## 2024-01-14 NOTE — Telephone Encounter (Signed)
Called and spoke to patient. Verified name and DOB.Patient is complaining of weakness and SOB on exertion. She deny any other symptoms. She report she has not been eating well and haven't had an appetite much since 12-09-2024 when her husband died. She also think she's had some weight loss and may be taking to much medication. Patient was scheduled to be seen in Afib clinic tomorrow but stated she was too weak and could not bring herself to go back to the hospital where her husband passed away. Patient was very tearful. Scheduled patient with Reather Littler, NP at 1:55 pm 1/31.

## 2024-01-14 NOTE — Telephone Encounter (Signed)
Pt c/o medication issue:  1. Name of Medication:  apixaban (ELIQUIS) 5 MG TABS tablet  hydrochlorothiazide (HYDRODIURIL) 25 MG tablet  metoprolol tartrate (LOPRESSOR) 25 MG tablet    2. How are you currently taking this medication (dosage and times per day)?   3. Are you having a reaction (difficulty breathing--STAT)?   4. What is your medication issue? Patient state she is feeling weak. She states she had an appt with the A-fib clinic for tomorrow but she had to cancel it, because she it too weak to go and doesn't have any to take her to the appt.  She state he husband recently passed away patient was tearful on the phone.

## 2024-01-15 ENCOUNTER — Ambulatory Visit (HOSPITAL_COMMUNITY): Payer: Medicare Other | Admitting: Internal Medicine

## 2024-01-16 ENCOUNTER — Ambulatory Visit: Payer: Medicare Other | Admitting: Cardiology

## 2024-01-17 ENCOUNTER — Encounter (HOSPITAL_COMMUNITY): Payer: Self-pay | Admitting: *Deleted

## 2024-01-17 ENCOUNTER — Emergency Department (HOSPITAL_COMMUNITY): Payer: Medicare Other

## 2024-01-17 ENCOUNTER — Other Ambulatory Visit: Payer: Self-pay

## 2024-01-17 ENCOUNTER — Emergency Department (HOSPITAL_COMMUNITY)
Admission: EM | Admit: 2024-01-17 | Discharge: 2024-01-17 | Disposition: A | Discharge status: Home / Self Care | Payer: Medicare Other | Attending: Emergency Medicine | Admitting: Emergency Medicine

## 2024-01-17 DIAGNOSIS — Z7901 Long term (current) use of anticoagulants: Secondary | ICD-10-CM | POA: Insufficient documentation

## 2024-01-17 DIAGNOSIS — I4811 Longstanding persistent atrial fibrillation: Secondary | ICD-10-CM | POA: Diagnosis not present

## 2024-01-17 DIAGNOSIS — Z20822 Contact with and (suspected) exposure to covid-19: Secondary | ICD-10-CM | POA: Diagnosis not present

## 2024-01-17 DIAGNOSIS — R0602 Shortness of breath: Secondary | ICD-10-CM | POA: Diagnosis present

## 2024-01-17 LAB — URINALYSIS, ROUTINE W REFLEX MICROSCOPIC
Bilirubin Urine: NEGATIVE
Glucose, UA: NEGATIVE mg/dL
Hgb urine dipstick: NEGATIVE
Ketones, ur: NEGATIVE mg/dL
Leukocytes,Ua: NEGATIVE
Nitrite: NEGATIVE
Protein, ur: NEGATIVE mg/dL
Specific Gravity, Urine: 1.009 (ref 1.005–1.030)
pH: 5 (ref 5.0–8.0)

## 2024-01-17 LAB — CBC WITH DIFFERENTIAL/PLATELET
Abs Immature Granulocytes: 0.02 10*3/uL (ref 0.00–0.07)
Basophils Absolute: 0 10*3/uL (ref 0.0–0.1)
Basophils Relative: 0 %
Eosinophils Absolute: 0 10*3/uL (ref 0.0–0.5)
Eosinophils Relative: 1 %
HCT: 37 % (ref 36.0–46.0)
Hemoglobin: 12.1 g/dL (ref 12.0–15.0)
Immature Granulocytes: 0 %
Lymphocytes Relative: 38 %
Lymphs Abs: 2.1 10*3/uL (ref 0.7–4.0)
MCH: 30.3 pg (ref 26.0–34.0)
MCHC: 32.7 g/dL (ref 30.0–36.0)
MCV: 92.5 fL (ref 80.0–100.0)
Monocytes Absolute: 0.6 10*3/uL (ref 0.1–1.0)
Monocytes Relative: 11 %
Neutro Abs: 2.8 10*3/uL (ref 1.7–7.7)
Neutrophils Relative %: 50 %
Platelets: 224 10*3/uL (ref 150–400)
RBC: 4 MIL/uL (ref 3.87–5.11)
RDW: 14.6 % (ref 11.5–15.5)
WBC: 5.6 10*3/uL (ref 4.0–10.5)
nRBC: 0 % (ref 0.0–0.2)

## 2024-01-17 LAB — COMPREHENSIVE METABOLIC PANEL
ALT: 18 U/L (ref 0–44)
AST: 23 U/L (ref 15–41)
Albumin: 4.4 g/dL (ref 3.5–5.0)
Alkaline Phosphatase: 55 U/L (ref 38–126)
Anion gap: 13 (ref 5–15)
BUN: 13 mg/dL (ref 8–23)
CO2: 29 mmol/L (ref 22–32)
Calcium: 9.9 mg/dL (ref 8.9–10.3)
Chloride: 93 mmol/L — ABNORMAL LOW (ref 98–111)
Creatinine, Ser: 0.97 mg/dL (ref 0.44–1.00)
GFR, Estimated: 60 mL/min — ABNORMAL LOW (ref >60)
Glucose, Bld: 110 mg/dL — ABNORMAL HIGH (ref 70–99)
Potassium: 3.9 mmol/L (ref 3.5–5.1)
Sodium: 135 mmol/L (ref 135–145)
Total Bilirubin: 0.7 mg/dL (ref 0.0–1.2)
Total Protein: 7.6 g/dL (ref 6.5–8.1)

## 2024-01-17 LAB — LIPASE, BLOOD: Lipase: 38 U/L (ref 11–51)

## 2024-01-17 LAB — RESP PANEL BY RT-PCR (RSV, FLU A&B, COVID)  RVPGX2
Influenza A by PCR: NEGATIVE
Influenza B by PCR: NEGATIVE
Resp Syncytial Virus by PCR: NEGATIVE
SARS Coronavirus 2 by RT PCR: NEGATIVE

## 2024-01-17 NOTE — ED Provider Notes (Signed)
[place] EMERGENCY DEPARTMENT AT [facility] Provider Note   CSN: [id number] Arrival date & time: 01/17/24  1144     History  Chief Complaint  Patient presents with   Shortness of Breath    Anita Dickerson is a 78 y.o. female.  78 year old female with prior medical history as detailed below presents for evaluation.  Patient with multiple chronic complaints.  Patient reports recent hospitalization with diagnosis of PE.  Patient reports compliance with Eliquis as previously prescribed.  She has an appointment later this week with cardiology.  Patient was also diagnosed with atrial fibrillation during recent admission.  She is tearful and distraught.  She is reporting that her husband recently died as well.  She appears to be anxious about her recent medical issues.  She denies chest pain or shortness of breath.  She denies vomiting.  She reports intermittent nausea.  She denies current nausea.  She denies fever.  The history is provided by the patient and medical records.       Home Medications Prior to Admission medications   Medication Sig Start Date End Date Taking? Authorizing Provider  apixaban (ELIQUIS) 5 MG TABS tablet Take 2 tablets (10 mg total) by mouth 2 (two) times daily for 6 days, THEN 1 tablet (5 mg total) 2 (two) times daily. 12/29/23 04/03/24  Pokhrel, Rebekah Chesterfield, MD  Ascorbic Acid (VITAMIN C PO) Take 1 tablet by mouth daily.    [provider]  benzocaine (ORAJEL) 10 % mucosal gel Use as directed in the mouth or throat 2 (two) times daily as needed for mouth pain. 12/29/23   Pokhrel, Rebekah Chesterfield, MD  cyanocobalamin (VITAMIN B12) 1000 MCG/ML injection Inject 1,000 mcg into the muscle every 30 (thirty) days.    [provider]  cyclobenzaprine (FLEXERIL) 5 MG tablet Take 5 mg by mouth at bedtime as needed for muscle spasms. 10/31/23 01/29/24  [provider]  ergocalciferol (VITAMIN D2) 1.25 MG (50000 UT) capsule Take 1 capsule by mouth every  7 (seven) days. 02/27/22   [provider]  feeding supplement (ENSURE ENLIVE / ENSURE PLUS) LIQD Take 237 mLs by mouth 2 (two) times daily between meals. 12/30/23   Pokhrel, Rebekah Chesterfield, MD  hydrochlorothiazide (HYDRODIURIL) 25 MG tablet Take 1 tablet by mouth daily. 10/28/23 10/27/24  [provider]  MAGNESIUM PO Take 1 tablet by mouth daily.    [provider]  metoprolol tartrate (LOPRESSOR) 25 MG tablet Take 1 tablet (25 mg total) by mouth 2 (two) times daily. 12/29/23   Pokhrel, Rebekah Chesterfield, MD  Multiple Vitamins-Minerals (ONE DAILY CALCIUM/IRON) TABS Take 1 capsule by mouth daily.    [provider]  potassium chloride (KLOR-CON) 10 MEQ tablet Take 20 mEq by mouth daily. 05/19/23 05/18/24  [provider]      Allergies    Tetanus toxoids, Nicotine, and Diazepam    Review of Systems   Review of Systems  All other systems reviewed and are negative.   Physical Exam Updated Vital Signs BP 114/87 (BP Location: Right Arm)   Pulse 89   Temp 98.8 F (37.1 C) (Oral)   Resp 18   Wt 59 kg   SpO2 100%   BMI 20.36 kg/m  Physical Exam  ED Results / Procedures / Treatments   Labs (all labs ordered are listed, but only abnormal results are displayed) Labs Reviewed  COMPREHENSIVE METABOLIC PANEL - Abnormal; Notable for the following components:      Result Value   Chloride 93 (*)  Glucose, Bld 110 (*)    GFR, Estimated 60 (*)    All other components within normal limits  RESP PANEL BY RT-PCR (RSV, FLU A&B, COVID)  [id number]  CBC WITH DIFFERENTIAL/PLATELET  LIPASE, BLOOD  URINALYSIS, ROUTINE W REFLEX MICROSCOPIC    EKG None  Radiology DG Chest 2 View Result Date: 01/17/2024 CLINICAL DATA:  Shortness of breath EXAM: CHEST - 2 VIEW COMPARISON:  12/27/2023 FINDINGS: The heart size and mediastinal contours are stable. Aortic atherosclerosis. No focal airspace consolidation, pleural effusion, or pneumothorax. The visualized skeletal structures are  unremarkable. IMPRESSION: No active cardiopulmonary disease. Electronically Signed   By: Duanne Guess D.O.   On: 01/17/2024 12:47    Procedures Procedures    Medications Ordered in ED Medications - No data to display  ED Course/ Medical Decision Making/ A&P                                 Medical Decision Making   Medical Screen Complete  This patient presented to the ED with complaint of nausea, weakness, anxiety.  This complaint involves an extensive number of treatment options. The initial differential diagnosis includes, but is not limited to, metabolic abnormality, arrhythmia, infection, etc.  This presentation is: Acute, Chronic, Self-Limited, Previously Undiagnosed, Uncertain Prognosis, Complicated, and Systemic Symptoms  Patient with multiple complaints with multiple recent medical issues.  Patient primarily appears to be anxious about multiple stressors both with her health and with the loss of her husband recently.  She is without specific acute complaint.  Screening labs and imaging and EKG are without demonstration of acute pathology.  Patient is reporting full compliance with previously prescribed Eliquis.  Patient is reassured by evaluation results.  She is comfortable with plan for discharge home.  She declines additional observation and/or admission.  Strict return precautions given and understood.   Additional history obtained:  External records from outside sources obtained and reviewed including prior ED visits and prior Inpatient records.    Lab Tests:  I ordered and personally interpreted labs.  The pertinent results include: CBC, CMP, lipase, COVID, flu, UA   Imaging Studies ordered:  I ordered imaging studies including chest x-ray I independently visualized and interpreted obtained imaging which showed NAD I agree with the radiologist interpretation.   Cardiac Monitoring:  The patient was maintained on a cardiac monitor.  I  personally viewed and interpreted the cardiac monitor which showed an underlying rhythm of: A-fib   Problem List / ED Course:  Atrial fibrillation   Reevaluation:  After the interventions noted above, I reevaluated the patient and found that they have: stayed the same  Disposition:  After consideration of the diagnostic results and the patients response to treatment, I feel that the patent would benefit from close outpatient follow-up.           Final Clinical Impression(s) / ED Diagnoses Final diagnoses:  Longstanding persistent atrial fibrillation [facility])    Rx / DC Orders ED Discharge Orders     None         Wynetta Fines, MD 01/17/24 1729

## 2024-01-17 NOTE — ED Provider Triage Note (Signed)
Emergency Medicine Provider Triage Evaluation Note  Anita Dickerson , a 77 y.o. female  was evaluated in triage.  Pt complains of shortness of breath.  Patient reports shortness of breath with exertion and at rest.  Reports that she was diagnosed with a pulmonary embolism about last month.  Has been taking Eliquis as prescribed.  Denies any skipped doses.  No chest pain.  Endorses nausea, no vomiting, dysuria, changes to habits.  No fevers.  Review of Systems  Positive: Shortness of breath Negative: Chest pain  Physical Exam  BP 114/87 (BP Location: Right Arm)   Pulse 89   Temp 98.8 F (37.1 C) (Oral)   Resp 18   Wt 59 kg   SpO2 100%   BMI 20.36 kg/m  Gen:   Awake, no distress   Resp:  Normal effort  MSK:   Moves extremities without difficulty  Other:  O2 saturation of 100% on room air  Medical Decision Making  Medically screening exam initiated at 1:01 PM.  Appropriate orders placed.  ELIOT BENCIVENGA was informed that the remainder of the evaluation will be completed by another provider, this initial triage assessment does not replace that evaluation, and the importance of remaining in the ED until their evaluation is complete.   Maxwell Marion, PA-C 01/17/24 1302

## 2024-01-17 NOTE — ED Triage Notes (Signed)
Here by POV from home for sob, also mentions nausea. No known fever. No missed doses of eliquis. EDPA present in triage. Also mentions bowel resection, chronic diarrhea, and associated dehydration which triggers my afib. Was instructed to hold her hydrochlorothiazide. Denies vomiting.

## 2024-01-17 NOTE — ED Notes (Signed)
Patient has urine culture in the main lab °

## 2024-01-17 NOTE — Discharge Instructions (Signed)
 Return for any problem.  ?

## 2024-01-18 NOTE — Progress Notes (Deleted)
 Cardiology Office Note:  .   Date:  01/18/2024  ID:  Anita Dickerson, DOB [birth year], MRN [id number] PCP: Franchot Charlies GRADE, MD  [facility] Providers Cardiologist:  Vinie JAYSON [name], MD   History of Present Illness: .   Anita Dickerson is a 78 y.o. 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 Patient is followed by Dr [name] and presents today for evaluation of weakness and SOB.  In the past, patient was followed by [facility] cardiology and EP team for management of atrial fibrillation, SVT. Wore a cardiac monitor on 2023 that showed sinus rhythm with 7.7% PAC burden. Echocardiogram in 2023 showed EF 65-70%, moderate MR, mild pulmonary HTN, mild to moderate TR. Patient as later admitted to [facility] in 12/2023 after she presented complaining of SOB. CTA chest showed an acute RLL PE. EKG showed atrial fibrillation with RVR. Cardiology was consulted. She underwent echocardiogram on 12/28/23 that showed EF 60-65%, no regional wall motion abnormalities, low normal RV systolic function, normal PA systolic pressure, mild MR. She was started on eliquis  and metoprolol . If she remained in afib by the time of outpatient follow up, recommended consideration of outpatient DCCV. Patient called the office on 12/30/23 concerned about an allergic reaction to eliquis . She reported abdominal pain, burning in her face, eyes, and nostrils. She also noted having an intolerance to Xarelto.   She was seen in out clinic on 01/08/24. At that time, patient reported that she continued to feel poorly. She had mild dyspnea with exertion, and was doing well on eliquis . She remained in atrial fibrillation, and hR had been well controlled at home. She was referred to the afib clinic given her long history of Afib (s/p ablation in 2020). Remained on eliquis .   Patient  called the clinic on 1/29 complaining of weakness. She also reported some SOB on exertion. She was schedule for an office appointment. However, she was seen in the ED on 2/1 for evaluation of shortness of breath. Patient was very distraught during her visit, and was anxious about her recent medical issues. She was without specific acute complaints. Labwork showed normal kidney function, normal liver function, normal electrolytes. CBC with diff normal. COVID, flu, RSV negative. UA normal. CXR was without acute cardiopulmonary disease. EKG showed atrial fibrillation with HR 87 BPM. She was discharged with instruction to follow up with her cardiologist   Weakness  - Patient was seen in the ED on 2/1 for weakness, shortness of breath. Patient was tearful and distraught, but did not have specific complaints.  - Workup overall reassuring in the ED- UA normal. COVID/Flu/RSV negative. CBC w/ Diff normal. CMP grossly normal. CXR without cardiopulmonary disease  -   Persistent Atrial Fibrillation  - Patient has long history of atrial fibrillation, previously underwent ablation in 2020 at an outside facility  - Had recurrence of afib in 12/2023. Started on eliquis  and metoprolol . She was referred to the afib clinic, but canceled her appointment due to weakness  - Echocardiogram from  12/2023 showed EF 60-65%, no regional wall motion abnormalities, low normal RV function, normal PA systolic pressure  -  - DCCV?   PE  - Diagnosed with PE in 12/2023, felt that PE occurred in the setting of atrial fibrillation  - Continue eliquis  5 mg BID   HTN  - BP well controlled on  current medications  - Continue hydrochlorothiazide  25 mg daily, metoprolol  tartrate 25 mg BID   Anxiety  Complicated Grief - Patient's husband passed away in 12/08/2024. She has no children, and very little family support  -   ROS: ***  Studies Reviewed: .        *** Risk Assessment/Calculations:   {Does this patient have ATRIAL  FIBRILLATION?:[id number]}         Physical Exam:   VS:  There were no vitals taken for this visit.   Wt Readings from Last 3 Encounters:  01/17/24 130 lb (59 kg)  01/08/24 131 lb (59.4 kg)  12/27/23 135 lb 12.9 oz (61.6 kg)    GEN: Well nourished, well developed in no acute distress NECK: No JVD; No carotid bruits CARDIAC: ***RRR, no murmurs, rubs, gallops RESPIRATORY:  Clear to auscultation without rales, wheezing or rhonchi  ABDOMEN: Soft, non-tender, non-distended EXTREMITIES:  No edema; No deformity   ASSESSMENT AND PLAN: .   ***    {Are you ordering a CV Procedure (e.g. stress test, cath, DCCV, TEE, etc)?   Press F2        :[id number]}  Dispo: ***  Signed, Rollo FABIENE Louder, PA-C

## 2024-01-19 ENCOUNTER — Ambulatory Visit: Payer: Medicare Other | Admitting: Student

## 2024-01-19 NOTE — Telephone Encounter (Signed)
Patient went to ED since this phone message was taken. She has a visit on 01/23/24

## 2024-01-23 ENCOUNTER — Ambulatory Visit: Payer: Medicare Other | Admitting: Cardiology

## 2024-02-03 ENCOUNTER — Ambulatory Visit: Payer: Medicare Other | Admitting: Physician Assistant

## 2024-02-13 ENCOUNTER — Encounter: Payer: Self-pay | Admitting: Internal Medicine

## 2024-02-13 ENCOUNTER — Ambulatory Visit: Payer: Medicare Other | Attending: Internal Medicine | Admitting: Internal Medicine

## 2024-02-13 VITALS — BP 140/78 | HR 91 | Ht 67.0 in | Wt 131.0 lb

## 2024-02-13 DIAGNOSIS — R197 Diarrhea, unspecified: Secondary | ICD-10-CM | POA: Insufficient documentation

## 2024-02-13 DIAGNOSIS — G2581 Restless legs syndrome: Secondary | ICD-10-CM | POA: Diagnosis present

## 2024-02-13 DIAGNOSIS — K90829 Short bowel syndrome, unspecified: Secondary | ICD-10-CM | POA: Insufficient documentation

## 2024-02-13 DIAGNOSIS — Z01810 Encounter for preprocedural cardiovascular examination: Secondary | ICD-10-CM | POA: Diagnosis present

## 2024-02-13 DIAGNOSIS — I4819 Other persistent atrial fibrillation: Secondary | ICD-10-CM | POA: Diagnosis present

## 2024-02-13 NOTE — Progress Notes (Signed)
 OFFICE NOTE  Chief Complaint:  Hospital follow-up  Primary Care Physician: Laurell Josephs, MD  HPI:  Anita Dickerson is a 78 y.o. 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  I saw her recently in consultation it was in the hospital.  She had presented with A-fib and RVR.  She is complained of persistent tachycardia.  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  She reports having had a prior A-fib ablation in 2020.  She was recently hospitalized for recurrent A-fib in the setting of pulmonary embolus and restarted on Eliquis.  This would likely be long-term therapy for her.  She is contemplating a repeat ablation.  In fact she has already scheduled this at [facility] in [place].  She has received care there previously.  Today she is also here for preoperative clearance.  She is supposed to have some upcoming dental work and their office is requesting clearance.  Finally, she has some concerns about tingling and restlessness of her legs particularly at night.  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 She has never used smokeless tobacco. She reports  that she does not drink alcohol and does not use drugs.  ALLERGIES:  Allergies  Allergen Reactions   Tetanus Toxoids Shortness Of Breath and Rash   Nicotine Itching    Reaction Type: Allergy   Diazepam Other (See Comments) and Rash    Reaction Type: Allergy; Reaction(s): Visual hallucinations,unknown    ROS: Pertinent items noted in HPI and remainder of comprehensive ROS otherwise negative.  HOME MEDS: Current Outpatient Medications on File Prior to Visit  Medication Sig Dispense Refill   apixaban (ELIQUIS) 5 MG TABS tablet Take 2 tablets (10 mg total) by mouth 2 (two) times daily for 6 days, THEN 1 tablet (5 mg total) 2 (two) times daily. 204 tablet 0   Ascorbic Acid (VITAMIN C PO) Take 1 tablet by mouth daily.     benzocaine (ORAJEL) 10 % mucosal gel Use as directed in the mouth or throat 2 (two) times daily as needed for mouth pain. 5.3 g 0   cyanocobalamin (VITAMIN B12) 1000 MCG/ML injection Inject 1,000 mcg into the muscle every 30 (thirty) days.     ergocalciferol (VITAMIN D2) 1.25 MG (50000 UT) capsule Take 1 capsule by mouth every 7 (seven) days.     feeding supplement (ENSURE ENLIVE / ENSURE PLUS) LIQD Take 237 mLs by mouth 2 (two) times daily between meals.  hydrochlorothiazide (HYDRODIURIL) 25 MG tablet Take 1 tablet by mouth daily.     metoprolol tartrate (LOPRESSOR) 25 MG tablet Take 1 tablet (25 mg total) by mouth 2 (two) times daily. 60 tablet 0   Multiple Vitamins-Minerals (ONE DAILY CALCIUM/IRON) TABS Take 1 capsule by mouth daily.     MAGNESIUM PO Take 1 tablet by mouth daily. (Patient not taking: Reported on 02/13/2024)     potassium chloride (KLOR-CON) 10 MEQ tablet Take 20 mEq by mouth daily. (Patient not taking: Reported on 02/13/2024)     No current facility-administered medications on file prior to visit.    LABS/IMAGING: No results found for this or any previous visit (from the past 48 hours). No results found.  LIPID PANEL:    Component Value  Date/Time   CHOL 192 02/13/2015 1055   TRIG 49.0 02/13/2015 1055   HDL 83.20 02/13/2015 1055   CHOLHDL 2 02/13/2015 1055   VLDL 9.8 02/13/2015 1055   LDLCALC 99 02/13/2015 1055     WEIGHTS: Wt Readings from Last 3 Encounters:  02/13/24 131 lb (59.4 kg)  01/17/24 130 lb (59 kg)  01/08/24 131 lb (59.4 kg)    VITALS: BP (!) 140/78   Pulse 91   Ht 5\' 7"  (1.702 m)   Wt 131 lb (59.4 kg)   SpO2 97%   BMI 20.52 kg/m   EXAM: General appearance: alert and no distress Lungs: clear to auscultation bilaterally Heart: irregularly irregular rhythm Extremities: extremities normal, atraumatic, no cyanosis or edema Neurologic: Grossly normal  EKG: Deferred  ASSESSMENT: Persistent atrial fibrillation status post prior ablation in 2020 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   Ms. Madewell was recently hospitalized with atrial fibrillation and pulmonary embolism which was in a posterior subsegmental branch of the right lower lobe pulmonary artery.  She is on Eliquis for that now.  She had previously been anticoagulated but stopped it a number of years ago.  She is also had recurrent A-fib and remains persistently in A-fib but rate controlled.  She has a scheduled ablation in May at [facility] in [place].  She may have some concerns about being able to get up there after her husband passed away recently.  She has been having continued issues with chronic diarrhea and a history of small bowel resection.  She wants to see a GI doctor about this.  She had remotely seen Dr. Adolph Pollack.  Will refer to Dr. Elnoria Howard.  In addition she reports some leg restlessness that sounds like restless leg syndrome.  Will advise evaluation with neurology.  Will be okay to hold Eliquis 2 days prior to her dental procedure and restart afterwards when safe from a bleeding standpoint.  Plan follow-up with [place] in 6 months or sooner as necessary.  Chrystie Nose, MD,  [facility], FACP  [place]  [facility]  Medical Director of the Advanced Lipid Disorders &  Cardiovascular Risk Reduction Clinic Diplomate of the American Board of Clinical Lipidology Attending Cardiologist  Direct Dial: [phone]  Fax: [phone]  Website:  [email or web address] 02/13/2024, 2:08 PM

## 2024-02-13 NOTE — Patient Instructions (Addendum)
 Medication Instructions:  NO CHANGES  *If you need a refill on your cardiac medications before your next appointment, please call your pharmacy*  Follow-Up: At [facility], you and your health needs are our priority.  As part of our continuing mission to provide you with exceptional heart care, we have created designated Provider Care Teams.  These Care Teams include your primary Cardiologist (physician) and Advanced Practice Providers (APPs -  Physician Assistants and Nurse Practitioners) who all work together to provide you with the care you need, when you need it.  We recommend signing up for the patient portal called "MyChart".  Sign up information is provided on this After Visit Summary.  MyChart is used to connect with patients for Virtual Visits (Telemedicine).  Patients are able to view lab/test results, encounter notes, upcoming appointments, etc.  Non-urgent messages can be sent to your provider as well.   To learn more about what you can do with MyChart, go to [email or web address].    Your next appointment:   6 months with Dr. Rennis Golden  Other Instructions  Referrals: - Dr. Jeani Hawking - gastroenterology  - Dr. Melvyn Novas - neurology

## 2024-03-29 ENCOUNTER — Telehealth: Payer: Self-pay | Admitting: Physician Assistant

## 2024-03-29 ENCOUNTER — Telehealth: Payer: Self-pay | Admitting: Internal Medicine

## 2024-03-29 ENCOUNTER — Encounter: Payer: Self-pay | Admitting: *Deleted

## 2024-03-29 NOTE — Telephone Encounter (Signed)
 Left message for pt to call.

## 2024-03-29 NOTE — Telephone Encounter (Signed)
   The patient called the answering service after-hours today.  She feels that her metoprolol and Eliquis are contributing to sensation of burning in her feet, feeling that her body is on fire, and insomnia. She reports she went off of the Eliquis briefly for oral surgery last week and this sensation completely resolved. She remained on metoprolol during that time. It came back when she restarted Eliquis.   She is receiving B12 injections for h/o B12 deficiency. Her PCP also just prescribed gabapentin last week. She has only taken sporadic dosing thus far. She reports she had labs and they were OK. I cannot see these results. She has an ablation scheduled at [facility] 04/26/24. She still follows with them for cardiology care in addition to us . This was an important site of care for her when her husband was still living.   We discussed that these symptoms sounded more consistent with neuropathy which can be an underlying condition with various causes. It is not entirely clear that her cardiac medications are causing this issue. Since symptoms resolved off trial of Eliquis during her oral surgery even with continuation of metoprolol, it seems less likely that metoprolol is causing the problem. I offered to switch her to trial of Xarelto but she actually plans to touch base with [facility] in the AM given the upcoming ablation next month - I asked her to ask them if they would be OK with switching in which case I would suggest getting a copy of her labs from [facility] to finalize Xarelto dosing. In the meantime I encourage her giving the gabapentin a trial as prescribed by primary care.  She would benefit from in-office visit with our team given her list of concerns. I will route to scheduling to call her to arrange. Would need to make sure she knows which location she is going to.  Also encouraged ongoing f/u PCP for patient's generalized somatic concerns. Time of call 20 minutes. The patient verbalized understanding  and gratitude.  Levon Penning N Norabelle Kondo, PA-C

## 2024-03-29 NOTE — Telephone Encounter (Signed)
 Pt c/o medication issue:  1. Name of Medication:   apixaban (ELIQUIS) 5 MG TABS tablet    metoprolol tartrate (LOPRESSOR) 25 MG tablet   2. How are you currently taking this medication (dosage and times per day)?   3. Are you having a reaction (difficulty breathing--STAT)? no  4. What is your medication issue? Patient states this medication is causing burning within her body. Please advise

## 2024-03-30 NOTE — Telephone Encounter (Signed)
 Patient identification verified by 2 forms. Hilton Lucky, RN    Called and spoke to patient  Patient scheduled for OV 5/1 at 9:40am with Dr. Maximo Spar  Patient agrees, no questions at this time

## 2024-03-30 NOTE — Telephone Encounter (Signed)
 Please see documentation in alternate 4/14 telephone encounter

## 2024-04-15 ENCOUNTER — Ambulatory Visit: Attending: Internal Medicine | Admitting: Internal Medicine

## 2024-04-15 ENCOUNTER — Encounter: Payer: Self-pay | Admitting: Internal Medicine

## 2024-04-15 VITALS — BP 126/94 | HR 93 | Ht 67.0 in | Wt 130.0 lb

## 2024-04-15 DIAGNOSIS — I10 Essential (primary) hypertension: Secondary | ICD-10-CM | POA: Insufficient documentation

## 2024-04-15 DIAGNOSIS — F32A Depression, unspecified: Secondary | ICD-10-CM | POA: Insufficient documentation

## 2024-04-15 DIAGNOSIS — I4819 Other persistent atrial fibrillation: Secondary | ICD-10-CM | POA: Diagnosis not present

## 2024-04-15 NOTE — Patient Instructions (Signed)
 Medication Instructions:  Your physician recommends that you continue on your current medications as directed. Please refer to the Current Medication list given to you today.  *If you need a refill on your cardiac medications before your next appointment, please call your pharmacy*  Lab Work: None ordered.  If you have labs (blood work) drawn today and your tests are completely normal, you will receive your results only by: MyChart Message (if you have MyChart) OR A paper copy in the mail If you have any lab test that is abnormal or we need to change your treatment, we will call you to review the results.  Testing/Procedures: None ordered.   Follow-Up: At [facility], you and your health needs are our priority.  As part of our continuing mission to provide you with exceptional heart care, our providers are all part of one team.  This team includes your primary Cardiologist (physician) and Advanced Practice Providers or APPs (Physician Assistants and Nurse Practitioners) who all work together to provide you with the care you need, when you need it.  Your next appointment:   3-4 months with Dr Candida Chalk PA or NP

## 2024-04-15 NOTE — Progress Notes (Signed)
 OFFICE NOTE  Chief Complaint:  Hospital follow-up  Primary Care Physician: Bettyjo Brunette, MD  HPI:  Anita Dickerson is a 78 y.o. 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  I saw her recently in consultation it was in the hospital.  She had presented with A-fib and RVR.  She is complained of persistent tachycardia.  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  She reports having had a prior A-fib ablation in 2020.  She was recently hospitalized for recurrent A-fib in the setting of pulmonary embolus and restarted on Eliquis .  This would likely be long-term therapy for her.  She is contemplating a repeat ablation.  In fact she has already scheduled this at [facility] in [place].  She has received care there previously.  Today she is also here for preoperative clearance.  She is supposed to have some upcoming dental work and their office is requesting clearance.  Finally, she has some concerns about tingling and restlessness of her legs particularly at night.  04/15/2024  Anita Dickerson returns today for follow-up.  She seems somewhat upset today.  She says she has no energy and feels like giving up.  I suspect she might have some depression.  She was grieving quite a bit after the death of her husband and now she has a lot of issues including her primary care provider which is a chain of practices that apparently is going out of business.  Also she says she cannot afford her house.  She says she has trouble paying for food after paying for her rent and she is not eating well.  Besides that stress she is in A-fib.  She has an upcoming ablation scheduled at [facility] on May 12.  I highly encouraged her to follow-up with that.  Her vitals today are actually pretty good with blood pressure 126/94 and a  pulse of 93.  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 She has never used smokeless tobacco. She reports that she does not drink alcohol and does not use drugs.  ALLERGIES:  Allergies  Allergen Reactions   Tetanus Toxoids Shortness Of Breath and Rash   Nicotine Itching    Reaction Type: Allergy   Diazepam Other (See Comments) and Rash    Reaction Type: Allergy; Reaction(s): Visual hallucinations,unknown    ROS: Pertinent items noted in HPI and remainder of comprehensive ROS otherwise negative.  HOME MEDS: Current Outpatient Medications on File Prior to Visit  Medication Sig Dispense Refill   Ascorbic Acid (VITAMIN C PO) Take 1 tablet by mouth daily.     benzocaine  (ORAJEL) 10 % mucosal gel Use as directed in the mouth or throat 2 (two) times daily as needed for mouth pain. 5.3 g 0   cyanocobalamin  (VITAMIN B12) 1000 MCG/ML injection Inject 1,000 mcg into the muscle every 30 (thirty) days.     ergocalciferol  (VITAMIN D2) 1.25 MG (50000 UT) capsule Take 1 capsule by mouth every 7 (seven) days.     MAGNESIUM PO Take 1 tablet by mouth daily.     metoprolol  tartrate (LOPRESSOR ) 25 MG tablet Take 1 tablet (25 mg total) by mouth 2 (two) times daily. 60 tablet 0   Multiple Vitamins-Minerals (ONE DAILY CALCIUM/IRON) TABS Take 1 capsule by mouth daily.      potassium chloride  (KLOR-CON ) 10 MEQ tablet Take 20 mEq by mouth daily.     apixaban  (ELIQUIS ) 5 MG TABS tablet Take 2 tablets (10 mg total) by mouth 2 (two) times daily for 6 days, THEN 1 tablet (5 mg total) 2 (two) times daily. 204 tablet 0   feeding supplement (ENSURE ENLIVE / ENSURE PLUS) LIQD Take 237 mLs by mouth 2 (two) times daily between meals. (Patient not taking: Reported on 04/15/2024)     hydrochlorothiazide  (HYDRODIURIL ) 25 MG tablet Take 1 tablet by mouth daily. (Patient not taking: Reported on 04/15/2024)     No current facility-administered medications on file prior to visit.    LABS/IMAGING: No results found for this or any previous visit (from the past 48 hours). No results found.  LIPID PANEL:    Component Value Date/Time   CHOL 192 02/13/2015 1055   TRIG 49.0 02/13/2015 1055   HDL 83.20 02/13/2015 1055   CHOLHDL 2 02/13/2015 1055   VLDL 9.8 02/13/2015 1055   LDLCALC 99 02/13/2015 1055     WEIGHTS: Wt Readings from Last 3 Encounters:  04/15/24 130 lb (59 kg)  02/13/24 131 lb (59.4 kg)  01/17/24 130 lb (59 kg)    VITALS: BP (!) 126/94 (BP Location: Left Arm, Patient Position: Sitting, Cuff Size: Normal)   Pulse 93   Ht 5\' 7"  (1.702 m)   Wt 130 lb (59 kg)   SpO2 97%   BMI 20.36 kg/m   EXAM: General appearance: alert and no distress Lungs: clear to auscultation bilaterally Heart: irregularly irregular rhythm Extremities: extremities normal, atraumatic, no cyanosis or edema Neurologic: Grossly normal  EKG: Deferred  ASSESSMENT: Persistent atrial fibrillation status post prior ablation in 2020 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   Ms. Huegel continues to struggle with fatigue and I suspect has depression and anxiety.  I encouraged her to follow-up with her PCP regarding this.  Blood pressure is reasonably controlled today.  Her A-fib rate is also controlled.  She has had prior ablation  and is scheduled for repeat ablation at [facility] on May 12.  I encouraged her to follow-up with that.  She has a lot of anxieties and concerns and I think she could benefit from treatment of this.  She is seeing [facility] however was told recently that they are going out of business.  She will need to establish with a new PCP.  Follow-up with our group in 3 to 4 months.  Hazle Lites, MD, [facility], FNLA, FACP  [place]  [facility]  Medical Director of the Advanced Lipid Disorders &  Cardiovascular Risk Reduction Clinic Diplomate of the American Board of Clinical Lipidology Attending Cardiologist  Direct Dial: [phone]  Fax: [phone]  Website:  [email or web address] 04/15/2024, 9:53 AM

## 2024-05-03 ENCOUNTER — Telehealth: Payer: Self-pay | Admitting: *Deleted

## 2024-05-03 NOTE — Telephone Encounter (Signed)
 Anita Dickerson just had an afib ablation (5/13) complicated by development of atrial tachycardia afterwards. She has been started on anti-arryhtmic medication. She will need to remain on anticoagulation for at least 1 month or more after the ablation or until she is cleared to hold anticoagulation by her doctors at [facility] in [place].  Dr. Maximo Spar

## 2024-05-03 NOTE — Telephone Encounter (Signed)
   Pre-operative Risk Assessment    Patient Name: Anita Dickerson  DOB: 1946/11/23 MRN: [id number]   Date of last office visit: 04/15/24 DR. HILTY Date of next office visit: 07/27/24 Lawana Pray, FNP   Request for Surgical Clearance    Procedure:  COLONOSCOPY  Date of Surgery:  Clearance 06/03/24                                Surgeon:  DR. HUNG Surgeon's Group or Practice Name:  [facility] Phone number:  [phone] Fax number:  [phone]   Type of Clearance Requested:   - Medical  - Pharmacy:  Hold Apixaban  (Eliquis )     Type of Anesthesia:  PROPOFOL    Additional requests/questions:    [name]   05/03/2024, 11:49 AM

## 2024-05-05 NOTE — Telephone Encounter (Signed)
 I will forward back to preop APP to please clarify if the procedure 06/03/24 will need to be post poned per notes from today due to recent ablation.   Pt has appt 07/27/24 with Lawana Pray, FNP.

## 2024-05-05 NOTE — Telephone Encounter (Signed)
   Name:  Anita Dickerson  DOB:  [birth year]  MRN:  [id number]   Primary Cardiologist: Hazle Lites, MD  Chart reviewed as part of pre-operative protocol coverage. Patient was contacted 05/05/2024 in reference to pre-operative risk assessment for pending surgery as outlined below.  Anita Dickerson was last seen by Dr. Maximo Spar.  Since that day, Anita Dickerson has done well but according to Dr. Maximo Spar, on 05/03/2024:  Anita Dickerson just had an afib ablation (5/13) complicated by development of atrial tachycardia afterwards. She has been started on anti-arryhtmic medication. She will need to remain on anticoagulation for at least 1 month or more after the ablation or until she is cleared to hold anticoagulation by her doctors at [facility] in [place].   Anita Dickerson will require a follow-up visit for further pre-operative risk assessment.  Pre-op covering staff: - Please schedule appointment and call patient to inform them. If patient already had an upcoming appointment within acceptable timeframe, please add "pre-op clearance" to the appointment notes so provider is aware. - Please contact requesting surgeon's office via preferred method (i.e, phone, fax) to inform them of need for appointment prior to surgery.  Friddie Jetty, NP 05/05/2024, 7:56 AM

## 2024-05-05 NOTE — Telephone Encounter (Signed)
 Yes, please postpone until seen by doctors in [place] in addition to continuing anticoagulation per Dr. Maximo Spar for a minimum of "one month or more".   Judeen Nose

## 2024-05-05 NOTE — Telephone Encounter (Signed)
 I will update the requesting office to please see all notes from preop APP.

## 2024-05-18 ENCOUNTER — Telehealth: Payer: Self-pay | Admitting: Internal Medicine

## 2024-05-18 ENCOUNTER — Encounter: Payer: Self-pay | Admitting: Cardiovascular Disease

## 2024-05-18 ENCOUNTER — Ambulatory Visit: Attending: Cardiovascular Disease | Admitting: Cardiovascular Disease

## 2024-05-18 VITALS — BP 124/80 | HR 88 | Ht 67.0 in

## 2024-05-18 DIAGNOSIS — I4819 Other persistent atrial fibrillation: Secondary | ICD-10-CM | POA: Insufficient documentation

## 2024-05-18 MED ORDER — METOPROLOL TARTRATE 50 MG PO TABS: 50.0000 mg | ORAL_TABLET | Freq: Two times a day (BID) | ORAL | 3 refills | Status: DC

## 2024-05-18 NOTE — Telephone Encounter (Signed)
 Called and spoke to pt regarding her symptoms. Pt states she "can not use the bathroom, is wobbly, has Neuropathy and can not sleep". She was recently started on Diltiazem and Flecainide by Cardiology at [facility] after ablation.   Scheduled her to see DOD (Nahser) today at 3:20 pm. Pt very appreciative. Advised her to bring in all her new medications and also a list of recent BP's/HR's; she verbalized understanding.

## 2024-05-18 NOTE — Progress Notes (Signed)
  Cardiology Office Note   Date:  05/18/2024  ID:  Anita Dickerson, Anita Dickerson 01/04/1946, MRN [id number] PCP: Bettyjo Brunette, MD  [facility] Providers Cardiologist:  Hazle Lites, MD     History of Present Illness Anita Dickerson is a 78 y.o. female with a history of paroxysmal atrial fibrillation, history of bowel resection, hypertension, prior Paget's disease.  She was recently seen by Dr. Maximo Spar.  She was hospitalized earlier this year with recurrent atrial fibrillation in the setting of a pulmonary embolus.  She was restarted on Eliquis  at that time.  She had A-fib ablation in [place].   Remains in  NSR   Will DC flecainide Increase metoprolol  to 50 BID   ROS:   Studies Reviewed       Risk Assessment/Calculations  CHA2DS2-VASc Score = 6   This indicates a 9.7% annual risk of stroke. The patient's score is based upon: CHF History: 0 HTN History: 1 Diabetes History: 0 Stroke History: 2 (thromboembolism (PE)) Vascular Disease History: 0 Age Score: 2 Gender Score: 1     Physical Exam VS:  BP 124/80   Pulse 88   Ht 5\' 7"  (1.702 m)   SpO2 97%   BMI 20.36 kg/m    Wt Readings from Last 3 Encounters:  04/15/24 130 lb (59 kg)  02/13/24 131 lb (59.4 kg)  01/17/24 130 lb (59 kg)    GEN: Well nourished, well developed in no acute distress NECK: No JVD; No carotid bruits CARDIAC: RRR, no murmurs, rubs, gallops RESPIRATORY:  Clear to auscultation without rales, wheezing or rhonchi  ABDOMEN: Soft, non-tender, non-distended EXTREMITIES:  No edema; No deformity    EKG Interpretation Date/Time:  Tuesday May 18 2024 15:16:42 EDT Ventricular Rate:  104 PR Interval:  190 QRS Duration:  70 QT Interval:  314 QTC Calculation: 412 R Axis:   82  Text Interpretation: Sinus tachycardia T wave abnormality, consider inferolateral ischemia When compared with ECG of 17-Jan-2024 17:08, pt has converted to sinus rhythm since previous ecg Confirmed by Ahmad Alert  ([id number]) on 05/18/2024 3:30:09 PM    ASSESSMENT AND PLAN    Peripheral neuropathy:  She has developed peripheral neuropathy.  - likely due to or exacerbated by the Flecainide  Will DC flecainide Will try to increase her metoprolol  to 50 BID   Will have her return to see the atrial fib clinic in 3-4 weeks  Follow up with Dr. Maximo Spar eventually   2.  PAF:  s/p ablation .   Has stayed in NSR for now. Unfortunately, we will need to DC the flecainide due to side effects  Follow up with afib clinic .  3. Constipation:  she also complained of significant constipation.   Flecainide may also be contributing to this   DC flecainide          Dispo: atrial fib clinic    Signed, Ahmad Alert, MD

## 2024-05-18 NOTE — Patient Instructions (Signed)
 Medication Instructions:  STOP Flecainide INCREASE Metoprolol  Tartrate to 50mg  twice daily *If you need a refill on your cardiac medications before your next appointment, please call your pharmacy*  Testing/Procedures: Ambulatory referral to A-fib clinic in 4 weeks  Follow-Up: At [facility], you and your health needs are our priority.  As part of our continuing mission to provide you with exceptional heart care, our providers are all part of one team.  This team includes your primary Cardiologist (physician) and Advanced Practice Providers or APPs (Physician Assistants and Nurse Practitioners) who all work together to provide you with the care you need, when you need it.  Your next appointment:   As Scheduled  Provider:   You will follow up in the Atrial Fibrillation Clinic located at [facility] Your provider will be: Clint R. Fenton, PA-C or Minnie Amber, PA-C

## 2024-05-18 NOTE — Telephone Encounter (Signed)
 Pt c/o medication issue:  1. Name of Medication: apixaban  (ELIQUIS ) 5 MG TABS tablet (Expired)  Diltlazem 120mg  cap 1 day Flecinade 50 mg every 12 hours  2. How are you currently taking this medication (dosage and times per day)? As written  3. Are you having a reaction (difficulty breathing--STAT)? No   4. What is your medication issue? Burning sensation in feet,  legs and hands. Pt had an ablation 5/13 in [place]

## 2024-05-25 ENCOUNTER — Telehealth: Payer: Self-pay | Admitting: Internal Medicine

## 2024-05-25 NOTE — Telephone Encounter (Signed)
 Patient called to follow-up on the status of her clearance to get a colonoscopy and when she should stop her Eliquis .  Patient provided fax# [phone] for [facility] (phone# [phone]).

## 2024-05-25 NOTE — Telephone Encounter (Signed)
 Patient called regarding clearance request as it was originially postponed from 6/13 to 6/19 for her colonoscopy since she had to be on her anticoagualtion for atleast 1 month. Please advise on if patient is cleared for her procedure on 6/19. Thank you!

## 2024-05-25 NOTE — Telephone Encounter (Signed)
 Message sent to preop pool regarding clearance request. Also spoke with the patient and informed her that we will keep her updated with the status of her clearance as her procedure is on 6/19

## 2024-05-26 NOTE — Telephone Encounter (Signed)
 Dr. Alroy Aspen You saw this patient last week. She needs colonoscopy and has a pending colonoscopy that will require eliquis  hold - this will be just outside her 30 day window after ablation.   Per our algorithm, we are reaching out to you since you have seen her in the last 30 days.   Is she cleared medically for colonoscopy? Based on prior notes, she can hold eliquis  after 05/30/24.

## 2024-05-26 NOTE — Telephone Encounter (Addendum)
   Name: TOM RAGSDALE  DOB: Aug 25, 1946  MRN: [id number]   Primary Cardiologist: Hazle Lites, MD  Chart reviewed as part of pre-operative protocol coverage.   Per Dr. Alroy Aspen, The patient is at low risk for colonoscopy. She may hold her eliquis  for 2 days prior to procedure .  Resume eliquis  as soon as  it is safe from a procedural standpoint.  Per chart review, she had an Afib ablation in [place], outside of our practice. We typically do not interrupt oral anticoagulation for 90 days from an ablation. Please reach out to her [place] team for guidance on when eliquis  can be held.    I will route this recommendation to the requesting party via Epic fax function and remove from pre-op pool. Please call with questions.  Warren Haber Payam Gribble, PA 05/26/2024, 1:09 PM

## 2024-05-27 ENCOUNTER — Emergency Department (HOSPITAL_COMMUNITY)

## 2024-05-27 ENCOUNTER — Emergency Department (HOSPITAL_COMMUNITY)
Admission: EM | Admit: 2024-05-27 | Discharge: 2024-05-27 | Disposition: A | Discharge status: Home / Self Care | Attending: Emergency Medicine | Admitting: Emergency Medicine

## 2024-05-27 ENCOUNTER — Other Ambulatory Visit: Payer: Self-pay

## 2024-05-27 DIAGNOSIS — I10 Essential (primary) hypertension: Secondary | ICD-10-CM | POA: Diagnosis not present

## 2024-05-27 DIAGNOSIS — R0602 Shortness of breath: Secondary | ICD-10-CM | POA: Insufficient documentation

## 2024-05-27 DIAGNOSIS — R000 Tachycardia, unspecified: Secondary | ICD-10-CM | POA: Diagnosis not present

## 2024-05-27 DIAGNOSIS — M7989 Other specified soft tissue disorders: Secondary | ICD-10-CM | POA: Insufficient documentation

## 2024-05-27 DIAGNOSIS — R4781 Slurred speech: Secondary | ICD-10-CM | POA: Insufficient documentation

## 2024-05-27 DIAGNOSIS — Z79899 Other long term (current) drug therapy: Secondary | ICD-10-CM | POA: Diagnosis not present

## 2024-05-27 DIAGNOSIS — Z7901 Long term (current) use of anticoagulants: Secondary | ICD-10-CM | POA: Diagnosis not present

## 2024-05-27 LAB — URINALYSIS, ROUTINE W REFLEX MICROSCOPIC
Bilirubin Urine: NEGATIVE
Glucose, UA: NEGATIVE mg/dL
Hgb urine dipstick: NEGATIVE
Ketones, ur: NEGATIVE mg/dL
Leukocytes,Ua: NEGATIVE
Nitrite: NEGATIVE
Protein, ur: NEGATIVE mg/dL
Specific Gravity, Urine: 1.006 (ref 1.005–1.030)
pH: 7 (ref 5.0–8.0)

## 2024-05-27 LAB — CBC
HCT: 34.7 % — ABNORMAL LOW (ref 36.0–46.0)
Hemoglobin: 11.6 g/dL — ABNORMAL LOW (ref 12.0–15.0)
MCH: 31.1 pg (ref 26.0–34.0)
MCHC: 33.4 g/dL (ref 30.0–36.0)
MCV: 93 fL (ref 80.0–100.0)
Platelets: 256 10*3/uL (ref 150–400)
RBC: 3.73 MIL/uL — ABNORMAL LOW (ref 3.87–5.11)
RDW: 15.1 % (ref 11.5–15.5)
WBC: 4.6 10*3/uL (ref 4.0–10.5)
nRBC: 0 % (ref 0.0–0.2)

## 2024-05-27 LAB — BRAIN NATRIURETIC PEPTIDE: B Natriuretic Peptide: 162.7 pg/mL — ABNORMAL HIGH (ref 0.0–100.0)

## 2024-05-27 LAB — BASIC METABOLIC PANEL WITH GFR
Anion gap: 9 (ref 5–15)
BUN: 12 mg/dL (ref 8–23)
CO2: 25 mmol/L (ref 22–32)
Calcium: 9.3 mg/dL (ref 8.9–10.3)
Chloride: 104 mmol/L (ref 98–111)
Creatinine, Ser: 0.92 mg/dL (ref 0.44–1.00)
GFR, Estimated: >60 mL/min (ref >60)
Glucose, Bld: 102 mg/dL — ABNORMAL HIGH (ref 70–99)
Potassium: 3.8 mmol/L (ref 3.5–5.1)
Sodium: 138 mmol/L (ref 135–145)

## 2024-05-27 LAB — D-DIMER, QUANTITATIVE: D-Dimer, Quant: <0.27 ug{FEU}/mL (ref 0.00–0.50)

## 2024-05-27 LAB — TROPONIN I (HIGH SENSITIVITY)
Troponin I (High Sensitivity): 8 ng/L (ref <18)
Troponin I (High Sensitivity): 7 ng/L (ref <18)

## 2024-05-27 LAB — TSH: TSH: 0.668 u[IU]/mL (ref 0.350–4.500)

## 2024-05-27 LAB — CBG MONITORING, ED: Glucose-Capillary: 84 mg/dL (ref 70–99)

## 2024-05-27 MED ORDER — METOPROLOL TARTRATE 5 MG/5ML IV SOLN
5.0000 mg | Freq: Once | INTRAVENOUS | Status: DC
  Filled 2024-05-27: qty 5

## 2024-05-27 MED ORDER — DILTIAZEM HCL 25 MG/5ML IV SOLN
5.0000 mg | Freq: Once | INTRAVENOUS | Status: AC
  Administered 2024-05-27: 5 mg via INTRAVENOUS
  Filled 2024-05-27: qty 5

## 2024-05-27 MED ORDER — DILTIAZEM HCL ER COATED BEADS 180 MG PO CP24: 180.0000 mg | ORAL_CAPSULE | Freq: Every day | ORAL | 0 refills | Status: DC

## 2024-05-27 MED ORDER — SODIUM CHLORIDE 0.9 % IV BOLUS
500.0000 mL | Freq: Once | INTRAVENOUS | Status: AC
  Administered 2024-05-27: 500 mL via INTRAVENOUS

## 2024-05-27 NOTE — ED Notes (Signed)
 Pt's EKG obtained and export was attempted, however technical difficulties. Due to this EKG scanned into chart by secretary.

## 2024-05-27 NOTE — ED Provider Triage Note (Signed)
 Emergency Medicine Provider Triage Evaluation Note  Anita Dickerson , a 78 y.o. female  was evaluated in triage.  Pt complains of shortness of breath today and speech stuttering that began acutely as she got to the ER.  Currently taking Eliquis  for A-fib, reporting no missed doses.  States that she has had some black specks in her vision, but does not have any visual field missing.  Review of Systems  Positive: N/a Negative: Numbness, weakness, tingling  Physical Exam  BP (!) 129/99   Pulse (!) 121   Temp 98.2 F (36.8 C) (Oral)   Resp 20   SpO2 100%  Gen:   Awake, no distress   Resp:  Normal effort  MSK:   Moves extremities without difficulty  Other:  Patient notably stuttering, without any signs of aphasia or apraxia or slurring of words.  Grip strength normal bilaterally, hip flexion and hip extension/knee flexion/knee extension, arm flexion/arm extension 5 out of 5 bilaterally.  No sensation loss to both upper and lower extremities.  No visual field deficits noted to left or right eye.  No arm drift.  Good coordination with finger-to-nose.  Medical Decision Making  Medically screening exam initiated at 2:08 PM.  Appropriate orders placed.  CEILI BOSHERS was informed that the remainder of the evaluation will be completed by another provider, this initial triage assessment does not replace that evaluation, and the importance of remaining in the ED until their evaluation is complete.     Hayes Lipps, [place] 05/27/24 1413

## 2024-05-27 NOTE — ED Provider Notes (Signed)
 [place] EMERGENCY DEPARTMENT AT [facility] Provider Note   CSN: [id number] Arrival date & time: 05/27/24  1352     Patient presents with: Shortness of Breath (Pt arrives today with shortness of breath, but comes in with other complaints, such as burning in her feet, slurred speech, and nausea. Pt takes hydrochlorothiazide , eloquis, diltiazem , and fleccainide. Provider in triage evaluating pt, endorses vision changes like black dots)   Anita Dickerson is a 78 y.o. female.   Patient complains of shortness of breath.  Patient has a history of tachycardia and is on Cardizem .  She has also had a ablation done.  She takes Eliquis ..  Patient had the ablation for atrial fibs.  Patient has hypertension.  The history is provided by the patient and medical records. No language interpreter was used.  Shortness of Breath Severity:  Mild Onset quality:  Sudden Timing:  Intermittent Progression:  Resolved Chronicity:  Recurrent Context: not activity   Relieved by:  Nothing Worsened by:  Nothing Ineffective treatments:  None tried Associated symptoms: no abdominal pain, no chest pain, no cough, no headaches and no rash        Prior to Admission medications   Medication Sig Start Date End Date Taking? Authorizing Provider  diltiazem  (CARDIZEM  CD) 180 MG 24 hr capsule Take 1 capsule (180 mg total) by mouth daily. 05/27/24  Yes Castor Gittleman, MD  apixaban  (ELIQUIS ) 5 MG TABS tablet Take 2 tablets (10 mg total) by mouth 2 (two) times daily for 6 days, THEN 1 tablet (5 mg total) 2 (two) times daily. 12/29/23 05/18/24  Pokhrel, Amador Bad, MD  benzocaine  (ORAJEL) 10 % mucosal gel Use as directed in the mouth or throat 2 (two) times daily as needed for mouth pain. 12/29/23   Pokhrel, Laxman, MD  cyanocobalamin  (VITAMIN B12) 1000 MCG/ML injection Inject 1,000 mcg into the muscle every 30 (thirty) days.    [provider]  ergocalciferol  (VITAMIN D2) 1.25 MG (50000 UT) capsule Take 1  capsule by mouth every 7 (seven) days. 02/27/22   [provider]  feeding supplement (ENSURE ENLIVE / ENSURE PLUS) LIQD Take 237 mLs by mouth 2 (two) times daily between meals. 12/30/23   Pokhrel, Laxman, MD  hydrochlorothiazide  (HYDRODIURIL ) 25 MG tablet Take 1 tablet by mouth daily. 10/28/23 10/27/24  [provider]  metoprolol  tartrate (LOPRESSOR ) 50 MG tablet Take 1 tablet (50 mg total) by mouth 2 (two) times daily. 05/18/24   Nahser, Lela Purple, MD  Multiple Vitamins-Minerals (ONE DAILY CALCIUM/IRON) TABS Take 1 capsule by mouth daily.    [provider]  potassium chloride  (KLOR-CON ) 10 MEQ tablet Take 20 mEq by mouth daily. 05/19/23 05/18/24  [provider]    Allergies: Tetanus toxoids, Nicotine, and Diazepam    Review of Systems  Constitutional:  Negative for appetite change and fatigue.  HENT:  Negative for congestion, ear discharge and sinus pressure.   Eyes:  Negative for discharge.  Respiratory:  Positive for shortness of breath. Negative for cough.   Cardiovascular:  Negative for chest pain.  Gastrointestinal:  Negative for abdominal pain and diarrhea.  Genitourinary:  Negative for frequency and hematuria.  Musculoskeletal:  Negative for back pain.  Skin:  Negative for rash.  Neurological:  Negative for seizures and headaches.  Psychiatric/Behavioral:  Negative for hallucinations.     Updated Vital Signs BP (!) 145/100 (BP Location: Left Arm)   Pulse (!) 113   Temp (!) 97.5 F (36.4 C) (Oral)   Resp Aaron Aas)  22   Ht 5' 7.25 (1.708 m)   Wt 57.6 kg   SpO2 100%   BMI 19.74 kg/m   Physical Exam Vitals and nursing note reviewed.  Constitutional:      Appearance: She is well-developed.  HENT:     Head: Normocephalic.     Nose: Nose normal.   Eyes:     General: No scleral icterus.    Conjunctiva/sclera: Conjunctivae normal.   Neck:     Thyroid : No thyromegaly.   Cardiovascular:     Rate and Rhythm: Regular rhythm. Tachycardia  present.     Heart sounds: No murmur heard.    No friction rub. No gallop.  Pulmonary:     Breath sounds: No stridor. No wheezing or rales.  Chest:     Chest wall: No tenderness.  Abdominal:     General: There is no distension.     Tenderness: There is no abdominal tenderness. There is no rebound.   Musculoskeletal:        General: Normal range of motion.     Cervical back: Neck supple.  Lymphadenopathy:     Cervical: No cervical adenopathy.   Skin:    Findings: No erythema or rash.   Neurological:     Mental Status: She is alert and oriented to person, place, and time.     Motor: No abnormal muscle tone.     Coordination: Coordination normal.   Psychiatric:        Behavior: Behavior normal.     (all labs ordered are listed, but only abnormal results are displayed) Labs Reviewed  BASIC METABOLIC PANEL WITH GFR - Abnormal; Notable for the following components:      Result Value   Glucose, Bld 102 (*)    All other components within normal limits  CBC - Abnormal; Notable for the following components:   RBC 3.73 (*)    Hemoglobin 11.6 (*)    HCT 34.7 (*)    All other components within normal limits  BRAIN NATRIURETIC PEPTIDE - Abnormal; Notable for the following components:   B Natriuretic Peptide 162.7 (*)    All other components within normal limits  URINALYSIS, ROUTINE W REFLEX MICROSCOPIC - Abnormal; Notable for the following components:   Color, Urine STRAW (*)    All other components within normal limits  D-DIMER, QUANTITATIVE  TSH  CBG MONITORING, ED  TROPONIN I (HIGH SENSITIVITY)  TROPONIN I (HIGH SENSITIVITY)    EKG: None  Radiology: CT Head Wo Contrast Result Date: 05/27/2024 CLINICAL DATA:  Neuro deficit, acute, stroke suspected. Visual disturbance. Stuttering speech. EXAM: CT HEAD WITHOUT CONTRAST TECHNIQUE: Contiguous axial images were obtained from the base of the skull through the vertex without intravenous contrast. RADIATION DOSE REDUCTION: This  exam was performed according to the departmental dose-optimization program which includes automated exposure control, adjustment of the mA and/or kV according to patient size and/or use of iterative reconstruction technique. COMPARISON:  Head CT 12/05/2023 FINDINGS: Brain: There is no evidence of an acute infarct, intracranial hemorrhage, mass, midline shift, or extra-axial fluid collection. Cerebral volume is within normal limits for age. The ventricles are normal in size. Patchy hypodensities in the cerebral white matter bilaterally are unchanged and nonspecific but compatible with mild chronic small vessel ischemic disease. Vascular: Calcified atherosclerosis at the skull base. No hyperdense vessel. Skull: No fracture or suspicious lesion. Sinuses/Orbits: Visualized paranasal sinuses and mastoid air cells are clear. No acute orbital finding. Other: None. IMPRESSION: 1. No evidence of acute intracranial  abnormality. 2. Mild chronic small vessel ischemic disease. Electronically Signed   By: Aundra Lee M.D.   On: 05/27/2024 16:26   DG Chest 2 View Result Date: 05/27/2024 CLINICAL DATA:  Chest pain. EXAM: CHEST - 2 VIEW COMPARISON:  January 17, 2024 FINDINGS: The heart size and mediastinal contours are within normal limits. Both lungs are clear. The visualized skeletal structures are unremarkable. IMPRESSION: No active cardiopulmonary disease. Electronically Signed   By: Rosalene Colon M.D.   On: 05/27/2024 15:29     Procedures   Medications Ordered in the ED  sodium chloride  0.9 % bolus 500 mL (0 mLs Intravenous Stopped 05/27/24 1846)  diltiazem  (CARDIZEM ) injection 5 mg (5 mg Intravenous Given 05/27/24 1741)   Patient was given extra Cardizem  to slow her heart down and it brought her heart rate under 100.  Chest x-ray and D-dimer unremarkable.  BNP minimally elevated                                 Medical Decision Making Amount and/or Complexity of Data Reviewed Labs: ordered. Radiology:  ordered.  Risk Prescription drug management.   Patient with shortness of breath most likely related to the uncontrolled tachycardia that she takes Cardizem  for.  She will increase her Cardizem  from 120 mg to 180 mg.  He will follow-up with her cardiologist     Final diagnoses:  None    ED Discharge Orders          Ordered    diltiazem  (CARDIZEM  CD) 180 MG 24 hr capsule  Daily        05/27/24 2014               Cheyenne Cotta, MD 05/29/24 1129

## 2024-05-27 NOTE — Discharge Instructions (Signed)
 Stop taking the Cardizem 120 mg that you are on now.  You will start taking a new Cardizem that is 180 mg.  You need to call your cardiologist tomorrow and set up an appointment for recheck next week

## 2024-05-28 ENCOUNTER — Ambulatory Visit: Attending: Physician Assistant | Admitting: Physician Assistant

## 2024-05-28 ENCOUNTER — Encounter: Payer: Self-pay | Admitting: Physician Assistant

## 2024-05-28 VITALS — BP 110/66 | HR 127 | Ht 67.0 in | Wt 126.0 lb

## 2024-05-28 DIAGNOSIS — I4819 Other persistent atrial fibrillation: Secondary | ICD-10-CM | POA: Diagnosis present

## 2024-05-28 DIAGNOSIS — I10 Essential (primary) hypertension: Secondary | ICD-10-CM | POA: Diagnosis present

## 2024-05-28 MED ORDER — METOPROLOL TARTRATE 25 MG PO TABS: 25.0000 mg | ORAL_TABLET | Freq: Two times a day (BID) | ORAL | 2 refills | Status: DC

## 2024-05-28 MED ORDER — FLECAINIDE ACETATE 50 MG PO TABS
50.0000 mg | ORAL_TABLET | Freq: Two times a day (BID) | ORAL | 3 refills | Status: DC
Start: 2024-05-28 — End: 2024-06-24

## 2024-05-28 NOTE — Patient Instructions (Signed)
 Medication Instructions:  RESTART METOPROLOL  TARTRATE 25 MG TWICE A DAY  RESTART  FLECAINIDE 50 MG TWICE A DAY  *If you need a refill on your cardiac medications before your next appointment, please call your pharmacy*  Lab Work: NO LABS If you have labs (blood work) drawn today and your tests are completely normal, you will receive your results only by: MyChart Message (if you have MyChart) OR A paper copy in the mail If you have any lab test that is abnormal or we need to change your treatment, we will call you to review the results.  Testing/Procedures: NO TESTING  Follow-Up: At [facility], you and your health needs are our priority.  As part of our continuing mission to provide you with exceptional heart care, our providers are all part of one team.  This team includes your primary Cardiologist (physician) and Advanced Practice Providers or APPs (Physician Assistants and Nurse Practitioners) who all work together to provide you with the care you need, when you need it.  Your next appointment:   FOLLOW UP June 02 2024  Provider:   Ervin Heath, PA  We recommend signing up for the patient portal called MyChart.  Sign up information is provided on this After Visit Summary.  MyChart is used to connect with patients for Virtual Visits (Telemedicine).  Patients are able to view lab/test results, encounter notes, upcoming appointments, etc.  Non-urgent messages can be sent to your provider as well.   To learn more about what you can do with MyChart, go to [email or web address].   Other Instructions You have been referred to Internal Medicine to establish PCP care

## 2024-05-28 NOTE — Progress Notes (Unsigned)
  Cardiology Office Note   Date:  05/28/2024  ID:  Anita, Dickerson 24-Apr-1946, MRN [id number] PCP: Bettyjo Brunette, MD  [facility] Providers Cardiologist:  Hazle Lites, MD { Click to update primary MD,subspecialty MD or APP then REFRESH:1}    History of Present Illness Anita Dickerson is a 78 y.o. female with past medical history of paroxysmal atrial fibrillation, history of bowel resection, hypertension, prior Paget's disease.  She was admitted in early 2025 due to recurrent atrial fibrillation in the setting of PE.  She had a prior A-fib ablation in 2020.  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  Echocardiogram obtained at the time showed EF of 60 to 65%, no regional wall motion abnormality, low normal RV systolic function, mild MR.  She was restarted on Eliquis  at the time.  She ultimately underwent A-fib ablation by Dr. Yvonna Herder at [facility] on 04/27/2024.  Postprocedure, patient was started on diltiazem  and flecainide.  Patient was most recently seen by Dr. Alroy Aspen on 06/14/2024, her flecainide was discontinued due to side effect of significant constipation and her metoprolol  increased to 50 mg twice a day.  She was maintaining sinus rhythm at that time.  She presented to the emergency room yesterday due to dyspnea, she was found to be in new 2-1 atrial flutter.  Previous EKG in January showed atrial fibrillation, therefore atrial flutter is new.  Her Cardizem  was increased from 120 mg daily to 180 mg daily.  She was discharged from the emergency room to follow-up with cardiology service today.  She is urgent add-on today for evaluation of atrial flutter with RVR.  On arrival, her heart rate was 127 bpm.  Talking with the patient, she has been compliant with Eliquis , diltiazem  and HCTZ.  She says she has not been taking the metoprolol .  I decided to restart metoprolol  at 25 mg twice a day.  I will also restart Eliquis  at  100 mg tonight followed by 50 mg twice a day starting tomorrow.  She has significant neuropathy.  She need a PCP in [place] area however has not been able to find one.  I will refer her to internal medicine team.  Otherwise, I plan to see her back next Wednesday for reassessment.  I am hoping that she would be out of atrial flutter by that point.     ROS: ***  Studies Reviewed      *** Risk Assessment/Calculations {Does this patient have ATRIAL FIBRILLATION?:[phone]}         Physical Exam VS:  There were no vitals taken for this visit.   Wt Readings from Last 3 Encounters:  05/27/24 127 lb (57.6 kg)  04/15/24 130 lb (59 kg)  02/13/24 131 lb (59.4 kg)    GEN: Well nourished, well developed in no acute distress NECK: No JVD; No carotid bruits CARDIAC: ***RRR, no murmurs, rubs, gallops RESPIRATORY:  Clear to auscultation without rales, wheezing or rhonchi  ABDOMEN: Soft, non-tender, non-distended EXTREMITIES:  No edema; No deformity   ASSESSMENT AND PLAN ***    {Are you ordering a CV Procedure (e.g. stress test, cath, DCCV, TEE, etc)?   Press F2        :[id number]}  Dispo: ***  Signed, Ervin Heath, PA

## 2024-06-01 ENCOUNTER — Telehealth: Payer: Self-pay | Admitting: Internal Medicine

## 2024-06-01 DIAGNOSIS — I4819 Other persistent atrial fibrillation: Secondary | ICD-10-CM

## 2024-06-01 MED ORDER — METOPROLOL TARTRATE 25 MG PO TABS
ORAL_TABLET | ORAL | Status: AC
Start: 2024-06-01 — End: ?

## 2024-06-01 NOTE — Telephone Encounter (Signed)
 Spoke with patient who stated the Metoprolol  is making her feel really tired  Blood pressures have been running 90's-120's/60-90's HR 90's-100's since starting the Metoprolol   She was asking if she could decrease to 1/2 tablet  Advised ok to decrease to 1/2 tablet for now and will forward to [employer] PA for review

## 2024-06-01 NOTE — Telephone Encounter (Signed)
 Advised patient, verbalized understanding

## 2024-06-01 NOTE — Telephone Encounter (Signed)
 Pt c/o medication issue:  1. Name of Medication:  metoprolol  tartrate (LOPRESSOR ) 25 MG tablet   2. How are you currently taking this medication (dosage and times per day)? As prescribed  3. Are you having a reaction (difficulty breathing--STAT)? Yes  4. What is your medication issue? States she has no energy and walks as if she is drunk since starting back on this medication. Reports she is miserable and doesn't know what to do. Patient has not yet taken medication today, but feels this is the cause of this. She was unable to leave the house yesterday due to feeling like this. Please advise.

## 2024-06-01 NOTE — Telephone Encounter (Signed)
 Ok to decrease to 1/2 tablet

## 2024-06-03 ENCOUNTER — Ambulatory Visit: Attending: Physician Assistant | Admitting: Physician Assistant

## 2024-06-03 ENCOUNTER — Telehealth: Payer: Self-pay | Admitting: *Deleted

## 2024-06-03 NOTE — Telephone Encounter (Signed)
   Pre-operative Risk Assessment    Patient Name: Anita Dickerson  DOB: 12-09-1946 MRN: [id number]   Date of last office visit: 05/28/24 Anita Dickerson, PAC Date of next office visit: 06/03/24 Anita Dickerson, PAC   Request for Surgical Clearance    Procedure:  Dental Extraction - Amount of Teeth to be Pulled:  3 TEETH FOR SURGICAL EXTRACTION  Date of Surgery:  Clearance TBD                                Surgeon:  DR. Donnita Gales, DDS Surgeon's Group or Practice Name:  [facility] Phone number:  [phone] Fax number:  [phone]   Type of Clearance Requested:   - Medical  - Pharmacy:  Hold Aspirin      Type of Anesthesia:  Local    Additional requests/questions:    Anita Dickerson   06/03/2024, 9:42 AM

## 2024-06-03 NOTE — Telephone Encounter (Signed)
 I will fax notes to the dental office to see notes from [place], [facility]. Pt no showed for her appt today with PAC. See the notes as well in regard to the clearance request.

## 2024-06-03 NOTE — Telephone Encounter (Signed)
   Name:  Anita Dickerson  DOB:  [birth year]  MRN:  [id number]   Primary Cardiologist: Hazle Lites, MD  Chart reviewed as part of pre-operative protocol coverage. Patient was contacted 06/03/2024 in reference to pre-operative risk assessment for pending surgery as outlined below.  Anita Dickerson was last seen on 05/28/2024.  She was in atrial flutter with RVR with heart rate of 126 at the time. [name] was supposed to follow up this morning but no showed for her appointment. Based on medical record, it appears she has contacted her cardiologist at [facility] and will be admitted tomorrow for cardiology evaluation there. I will defer cardiac clearance to Dr. Yvonna Herder of [facility] for cardiac clearance.   Yvonna Herder, MD  [address]  [place], [place] [address]  [phone] (Work)  [phone] ([address]   [place], [place] 06/03/2024, 9:54 AM

## 2024-06-15 ENCOUNTER — Ambulatory Visit (HOSPITAL_COMMUNITY): Admitting: Internal Medicine

## 2024-06-24 ENCOUNTER — Telehealth: Payer: Self-pay | Admitting: Internal Medicine

## 2024-06-24 NOTE — Telephone Encounter (Signed)
 Spoke to patient she stated B/P cuff gave a error this morning when she checked B/P 171/113. Advised patient to put in new batteries in her B/P monitor. Patient put in new batteries.She rechecked B/P 127/84 pulse 77. Medications reviewed with patient.Medication listed in chart are not correct.I corrected medication list.Patient is not taking HTCZ,Diltiazem ,Flecainide ,Potassium.Patient stated she took Potassium 10 meq 2 tablets and over the encounter Magnesium 1 tablet last night for her dry skin.She takes as needed. Patient advised to check B/P daily and keep a log.Appointment scheduled with Scot Ford PA 7/24 at 1:55 pm.Advised to bring all medications and B/P readings. Message sent to Hao Meng PA for advice.

## 2024-06-24 NOTE — Telephone Encounter (Signed)
 Thank you. Channing, I am aware that the patient was recently admitted by her cardiologist at [facility] and switched flecanide to amiodarone due to side effect. Since BP is well controlled, agree with continued monitoring and bring all the medications she is currently taking to her next appt so we can reconcile our record. Promiss Labarbera

## 2024-06-24 NOTE — Telephone Encounter (Signed)
 Called patient left message on personal voice mail Scot Ford PA advised to continue current medications.Monitor B/P daily and bring readings and all medications to appointment 7/24 at 1:55 pm.

## 2024-06-24 NOTE — Telephone Encounter (Signed)
 Pt c/o BP issue: STAT if pt c/o blurred vision, one-sided weakness or slurred speech.   1. What is your BP concern?  BP is fluctuating to high.  2. Have you taken any BP medication today? Took Amlodipine about 1 hour ago.  3. What are your last 5 BP readings? 171/113  4. Are you having any other symptoms (ex. Dizziness, headache, blurred vision, passed out)?  Blurred vision since starting on amlodipine, issues with balance

## 2024-07-08 ENCOUNTER — Ambulatory Visit: Attending: Physician Assistant | Admitting: Physician Assistant

## 2024-07-08 ENCOUNTER — Encounter: Payer: Self-pay | Admitting: Physician Assistant

## 2024-07-08 VITALS — BP 112/76 | HR 81 | Ht 67.0 in | Wt 126.0 lb

## 2024-07-08 DIAGNOSIS — Z0000 Encounter for general adult medical examination without abnormal findings: Secondary | ICD-10-CM | POA: Diagnosis present

## 2024-07-08 DIAGNOSIS — I4819 Other persistent atrial fibrillation: Secondary | ICD-10-CM | POA: Insufficient documentation

## 2024-07-08 DIAGNOSIS — I10 Essential (primary) hypertension: Secondary | ICD-10-CM | POA: Diagnosis present

## 2024-07-08 NOTE — Patient Instructions (Addendum)
 Medication Instructions:  Your physician recommends that you continue on your current medications as directed. Please refer to the Current Medication list given to you today.  *If you need a refill on your cardiac medications before your next appointment, please call your pharmacy*  Lab Work: TSH, Vit B12, LFTs  Testing/Procedures: NONE ordered at this time of appointment   Follow-Up: At [facility], you and your health needs are our priority.  As part of our continuing mission to provide you with exceptional heart care, our providers are all part of one team.  This team includes your primary Cardiologist (physician) and Advanced Practice Providers or APPs (Physician Assistants and Nurse Practitioners) who all work together to provide you with the care you need, when you need it.  Your next appointment:   1 month f/u with Hao Meng PA 4-6 month Dr. Mona   Provider:   Vinie JAYSON Mona, MD or Scot Ford, PA-C          We recommend signing up for the patient portal called MyChart.  Sign up information is provided on this After Visit Summary.  MyChart is used to connect with patients for Virtual Visits (Telemedicine).  Patients are able to view lab/test results, encounter notes, upcoming appointments, etc.  Non-urgent messages can be sent to your provider as well.   To learn more about what you can do with MyChart, go to [email or web address].

## 2024-07-08 NOTE — Progress Notes (Unsigned)
 Cardiology Office Note   Date:  07/08/2024  ID:  Anita, Dickerson [birth year], MRN [id number] PCP: Franchot Charlies GRADE, MD  [facility] Providers Cardiologist:  Vinie JAYSON [name], MD { Click to update primary MD,subspecialty MD or APP then REFRESH:1}    History of Present Illness Anita Dickerson is a 78 y.o. female with past medical history of paroxysmal atrial fibrillation, history of bowel resection, hypertension, prior Paget's disease.  She was admitted in early 2025 due to recurrent atrial fibrillation in the setting of PE.  She had a prior A-fib ablation in 2020.  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  Echocardiogram obtained at the time showed EF of 60 to 65%, no regional wall motion abnormality, low normal RV systolic function, mild MR.  She was restarted on Eliquis  at the time.  She ultimately underwent A-fib ablation by Dr. [name] at [facility] on 04/27/2024.  Postprocedure, patient was started on diltiazem  and flecainide .  Patient was most recently seen by Dr. [name] on 06/14/2024, her flecainide  was discontinued due to side effect of significant constipation and her metoprolol  increased to 50 mg twice a day.  She was maintaining sinus rhythm at that time.  She presented to the emergency room on 05/27/2024 due to dyspnea, she was found to be in new 2-1 atrial flutter.  Previous EKG in January showed atrial fibrillation, therefore atrial flutter is new.  Her Cardizem  was increased from 120 mg daily to 180 mg daily.  She was discharged from the emergency room to follow-up with cardiology service today.  I saw the patient on 05/28/2024 immediately after the ED visit, on arrival, her heart rate was 127 in the office.  I decided to restart metoprolol  25 mg twice a day on top of the diltiazem  for better rate control.  I also restarted her flecainide  at 50 mg twice a day.  One week follow-up was arranged for  reassessment.  Unfortunately, she could not tolerate the flecainide .  She contacted her cardiologist at [facility] and was admitted there on 06/04/2024.  She was loaded on amiodarone therapy and underwent cardioversion on 06/07/2024.  Flecainide  was discontinued.  Metoprolol  also reduced to 12.5 mg twice a day.  She was discharged on oral amiodarone 200 mg daily.  Hydrochlorothiazide  was also discontinued.  Outpatient pulmonary function test has been ordered in 6 months at [facility] per amiodarone protocol over there.  Patient presents today for follow-up.  She is holding sinus rhythm based on today's EKG.  Blood pressure stable.  She has multiple concerns.  She feels like something is off with her head and requested EEG, this will need to be assessed by PCP.  She need a local PCP, I will refer her to Anita Dickerson primary care.  I will obtain liver function test and TSH since she is on amiodarone.  She also periodically gets vitamin B12 shots and requested a vitamin B12 level.  I will see her back in 6 weeks.  She can follow-up with Dr. [name] in 4 to 5 months.  She denies any chest pain.  Her palpitation has improved after starting on amiodarone therapy.  She is concerned about amiodarone toxicity, she is aware that she need a yearly eye exam while on the amiodarone.  [facility] EP service plan to get a PFT in 6 months.  ROS: ***  Studies Reviewed      *** Risk Assessment/Calculations {Does this patient have ATRIAL FIBRILLATION?:[phone]}  Physical Exam VS:  BP 112/76 (BP Location: Right Arm, Patient Position: Sitting)   Pulse 81   Ht 5' 7 (1.702 m)   Wt 126 lb (57.2 kg)   SpO2 97%   BMI 19.73 kg/m        Wt Readings from Last 3 Encounters:  07/08/24 126 lb (57.2 kg)  05/28/24 126 lb (57.2 kg)  05/27/24 127 lb (57.6 kg)    GEN: Well nourished, well developed in no acute distress NECK: No JVD; No carotid bruits CARDIAC: ***RRR, no murmurs, rubs, gallops RESPIRATORY:  Clear to  auscultation without rales, wheezing or rhonchi  ABDOMEN: Soft, non-tender, non-distended EXTREMITIES:  No edema; No deformity   ASSESSMENT AND PLAN ***    {Are you ordering a CV Procedure (e.g. stress test, cath, DCCV, TEE, etc)?   Press F2        :[id number]}  Dispo: ***  Signed, Scot Ford, PA

## 2024-07-09 ENCOUNTER — Ambulatory Visit: Payer: Self-pay | Admitting: Physician Assistant

## 2024-07-09 LAB — VITAMIN B12: Vitamin B-12: 310 pg/mL (ref 232–1245)

## 2024-07-09 LAB — TSH: TSH: 0.263 u[IU]/mL — ABNORMAL LOW (ref 0.450–4.500)

## 2024-07-09 LAB — HEPATIC FUNCTION PANEL
ALT: 14 IU/L (ref 0–32)
AST: 18 IU/L (ref 0–40)
Albumin: 4.5 g/dL (ref 3.8–4.8)
Alkaline Phosphatase: 61 IU/L (ref 44–121)
Bilirubin Total: 0.3 mg/dL (ref 0.0–1.2)
Bilirubin, Direct: 0.14 mg/dL (ref 0.00–0.40)
Total Protein: 7.2 g/dL (ref 6.0–8.5)

## 2024-07-12 ENCOUNTER — Other Ambulatory Visit: Payer: Self-pay

## 2024-07-12 DIAGNOSIS — I480 Paroxysmal atrial fibrillation: Secondary | ICD-10-CM

## 2024-07-12 DIAGNOSIS — Z79899 Other long term (current) drug therapy: Secondary | ICD-10-CM

## 2024-07-27 ENCOUNTER — Ambulatory Visit: Admitting: General Practice

## 2024-09-02 ENCOUNTER — Ambulatory Visit: Attending: Physician Assistant | Admitting: Physician Assistant

## 2024-09-02 ENCOUNTER — Encounter: Payer: Self-pay | Admitting: Physician Assistant

## 2024-09-02 VITALS — BP 116/66 | HR 87 | Ht 67.25 in | Wt 125.0 lb

## 2024-09-02 DIAGNOSIS — I4819 Other persistent atrial fibrillation: Secondary | ICD-10-CM | POA: Insufficient documentation

## 2024-09-02 DIAGNOSIS — I10 Essential (primary) hypertension: Secondary | ICD-10-CM | POA: Insufficient documentation

## 2024-09-02 MED ORDER — APIXABAN 5 MG PO TABS: 5.0000 mg | ORAL_TABLET | Freq: Two times a day (BID) | ORAL | Status: AC

## 2024-09-02 NOTE — Patient Instructions (Signed)
 Medication Instructions:  Your physician recommends that you continue on your current medications as directed. Please refer to the Current Medication list given to you today.  *If you need a refill on your cardiac medications before your next appointment, please call your pharmacy*  Lab Work: None If you have labs (blood work) drawn today and your tests are completely normal, you will receive your results only by: MyChart Message (if you have MyChart) OR A paper copy in the mail If you have any lab test that is abnormal or we need to change your treatment, we will call you to review the results.  Testing/Procedures: None  Follow-Up: At [facility], you and your health needs are our priority.  As part of our continuing mission to provide you with exceptional heart care, our providers are all part of one team.  This team includes your primary Cardiologist (physician) and Advanced Practice Providers or APPs (Physician Assistants and Nurse Practitioners) who all work together to provide you with the care you need, when you need it.  Your next appointment:    Keep scheduled follow up   Provider:   Vinie JAYSON [name], MD    We recommend signing up for the patient portal called MyChart.  Sign up information is provided on this After Visit Summary.  MyChart is used to connect with patients for Virtual Visits (Telemedicine).  Patients are able to view lab/test results, encounter notes, upcoming appointments, etc.  Non-urgent messages can be sent to your provider as well.   To learn more about what you can do with MyChart, go to [email or web address].   Other Instructions [facility]- [phone]

## 2024-09-02 NOTE — Progress Notes (Signed)
 Cardiology Office Note   Date:  09/02/2024  ID:  Anita Dickerson, DOB 11/03/1946, MRN [id number] PCP: Patient, No Pcp Per  [facility] Providers Cardiologist:  Vinie JAYSON [name], MD     History of Present Illness Anita Dickerson is a 78 y.o. female with past medical history of persistent atrial fibrillation, history of bowel resection, hypertension, prior Paget's disease.  She was admitted in early 2025 due to recurrent atrial fibrillation in the setting of PE.  She had a prior A-fib ablation in 2020.  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  Echocardiogram obtained at the time showed EF of 60 to 65%, no regional wall motion abnormality, low normal RV systolic function, mild MR.  She was restarted on Eliquis  at the time.  She ultimately underwent A-fib ablation by Dr. [name] at [facility] on 04/27/2024.  Postprocedure, patient was started on diltiazem  and flecainide .  Patient was most recently seen by Dr. [name] on 06/14/2024, her flecainide  was discontinued due to side effect of significant constipation and her metoprolol  increased to 50 mg twice a day.  She was maintaining sinus rhythm at that time.  She presented to the emergency room on 05/27/2024 due to dyspnea, she was found to be in new 2-1 atrial flutter.  Previous EKG in January showed atrial fibrillation, therefore atrial flutter is new.  Her Cardizem  was increased from 120 mg daily to 180 mg daily. I saw the patient on 05/28/2024 immediately after the ED visit, on arrival, her heart rate was 127 in the office.  I decided to restart metoprolol  25 mg twice a day on top of the diltiazem  for better rate control.  I also restarted her flecainide  at 50 mg twice a day.  One week follow-up was arranged for reassessment.   Unfortunately, she could not tolerate the flecainide .  She contacted her cardiologist at [facility] and was admitted there on 06/04/2024.  She was loaded on  amiodarone therapy and underwent cardioversion on 06/07/2024.  Flecainide  was discontinued.  Metoprolol  also reduced to 12.5 mg twice a day.  She was discharged on oral amiodarone 200 mg daily.  Hydrochlorothiazide  was also discontinued.  She has been seen by [facility] cardiologist on 07/30/2023, based on the office note, amiodarone was stopped due to tremors.  Now thinking about proceeding with Watchman device.  Patient presents today for follow-up.  She says her PCP in [place]  has referred her to neurology service and GYN service here.  She has not been able to get the EEG she wanted.  She has not had any major palpitation.  She is tolerating metoprolol  to tartrate 12.5 mg twice a day and Eliquis .  She has already stopped amiodarone per instructions by her [facility] cardiologist.  Overall, she is doing well from the cardiac perspective.  She has not completely convinced to proceed with Watchman device, but plans to proceed with additional cardiac testing ordered at [facility].  ROS:   She denies chest pain, palpitations, dyspnea, pnd, orthopnea, n, v, dizziness, syncope, edema, weight gain, or early satiety. All other systems reviewed and are otherwise negative except as noted above.    Studies Reviewed      Cardiac Studies & Procedures   ______________________________________________________________________________________________     ECHOCARDIOGRAM  ECHOCARDIOGRAM COMPLETE 12/28/2023  Narrative ECHOCARDIOGRAM REPORT    Patient Name:   Anita Dickerson Date of Exam: 12/28/2023 Medical Rec #:  [id number]  Height:       67.0 in Accession #:    [id number]       Weight:       135.8 lb Date of Birth:  22-Apr-1946        BSA:          1.715 m Patient Age:    77 years         BP:           118/68 mmHg Patient Gender: F                HR:           98 bpm. Exam Location:  Inpatient  Procedure: 2D Echo, Cardiac Doppler and Color Doppler  Indications:    R06.02 SOB  History:        Patient has no prior  history of Echocardiogram examinations. Abnormal ECG; Arrythmias:Atrial Fibrillation. Pulmonary embolus. Ablation.  Sonographer:    Ellouise Mose RDCS Referring Phys: [id number] CLAUDIA CLAIBORNE  IMPRESSIONS   1. Left ventricular ejection fraction, by estimation, is 60 to 65%. The left ventricle has normal function. The left ventricle has no regional wall motion abnormalities. Left ventricular diastolic parameters are indeterminate. 2. Right ventricular systolic function is low normal. The right ventricular size is mildly enlarged. There is normal pulmonary artery systolic pressure. 3. The mitral valve is normal in structure. Mild mitral valve regurgitation. 4. The aortic valve is tricuspid. Aortic valve regurgitation is not visualized.  FINDINGS Left Ventricle: Left ventricular ejection fraction, by estimation, is 60 to 65%. The left ventricle has normal function. The left ventricle has no regional wall motion abnormalities. The left ventricular internal cavity size was normal in size. There is no left ventricular hypertrophy. Left ventricular diastolic parameters are indeterminate.  Right Ventricle: The right ventricular size is mildly enlarged. Right vetricular wall thickness was not assessed. Right ventricular systolic function is low normal. There is normal pulmonary artery systolic pressure. The tricuspid regurgitant velocity is 2.17 m/s, and with an assumed right atrial pressure of 3 mmHg, the estimated right ventricular systolic pressure is 21.8 mmHg.  Left Atrium: Left atrial size was normal in size.  Right Atrium: Right atrial size was normal in size.  Pericardium: There is no evidence of pericardial effusion.  Mitral Valve: The mitral valve is normal in structure. Mild mitral valve regurgitation. MV peak gradient, 8.8 mmHg. The mean mitral valve gradient is 2.0 mmHg.  Tricuspid Valve: The tricuspid valve is normal in structure. Tricuspid valve regurgitation is mild.  Aortic Valve:  The aortic valve is tricuspid. Aortic valve regurgitation is not visualized.  Pulmonic Valve: The pulmonic valve was normal in structure. Pulmonic valve regurgitation is mild to moderate.  Aorta: The aortic root and ascending aorta are structurally normal, with no evidence of dilitation.  IAS/Shunts: No atrial level shunt detected by color flow Doppler.   LEFT VENTRICLE PLAX 2D LVIDd:         3.40 cm LVIDs:         2.40 cm LV PW:         2.10 cm LV IVS:        1.00 cm LVOT diam:     2.30 cm LV SV:         66 LV SV Index:   38 LVOT Area:     4.15 cm  LV Volumes (MOD) LV vol d, MOD A2C: 54.7 ml LV vol d, MOD A4C: 41.5 ml LV vol s, MOD A2C: 18.0 ml  LV vol s, MOD A4C: 22.5 ml LV SV MOD A2C:     36.7 ml LV SV MOD A4C:     41.5 ml LV SV MOD BP:      31.2 ml  RIGHT VENTRICLE             IVC RV S prime:     11.60 cm/s  IVC diam: 2.10 cm TAPSE (M-mode): 1.2 cm  LEFT ATRIUM             Index        RIGHT ATRIUM           Index LA diam:        3.30 cm 1.92 cm/m   RA Area:     15.00 cm LA Vol (A2C):   25.1 ml 14.63 ml/m  RA Volume:   36.90 ml  21.51 ml/m LA Vol (A4C):   38.3 ml 22.33 ml/m LA Biplane Vol: 32.0 ml 18.65 ml/m AORTIC VALVE             PULMONIC VALVE LVOT Vmax:   83.60 cm/s  PR End Diast Vel: 1.49 msec LVOT Vmean:  53.600 cm/s LVOT VTI:    0.158 m  AORTA Ao Root diam: 3.20 cm Ao Asc diam:  3.30 cm  MITRAL VALVE               TRICUSPID VALVE MV Area (PHT): 4.58 cm    TR Peak grad:   18.8 mmHg MV Area VTI:   2.59 cm    TR Vmax:        217.00 cm/s MV Peak grad:  8.8 mmHg MV Mean grad:  2.0 mmHg    SHUNTS MV Vmax:       1.48 m/s    Systemic VTI:  0.16 m MV Vmean:      66.4 cm/s   Systemic Diam: 2.30 cm MV Decel Time: 166 msec MV E velocity: 90.15 cm/s  Vina Gull MD Electronically signed by Vina Gull MD Signature Date/Time: 12/28/2023/1:56:43 PM    Final           ______________________________________________________________________________________________      Risk Assessment/Calculations           Physical Exam VS:  BP 116/66   Pulse 87   Ht 5' 7.25 (1.708 m)   Wt 125 lb (56.7 kg)   SpO2 98%   BMI 19.43 kg/m        Wt Readings from Last 3 Encounters:  09/02/24 125 lb (56.7 kg)  07/08/24 126 lb (57.2 kg)  05/28/24 126 lb (57.2 kg)    GEN: Well nourished, well developed in no acute distress NECK: No JVD; No carotid bruits CARDIAC: RRR, no murmurs, rubs, gallops RESPIRATORY:  Clear to auscultation without rales, wheezing or rhonchi  ABDOMEN: Soft, non-tender, non-distended EXTREMITIES:  No edema; No deformity   ASSESSMENT AND PLAN  Persistent atrial fibrillation: Initially was placed on Eliquis  in early 2025 due to A-fib in the setting of PE.  She underwent A-fib ablation at [facility] cardiology service in May 2025.  Since then, she has had recurrent A-fib.  She was unable to tolerate flecainide .  She was able to undergo successful cardioversion on amiodarone.  However later developed tremor which she attributed to amiodarone side effect.  Her [facility] cardiologist has stopped her amiodarone therapy.  She is currently on 12.5 mg twice a day of metoprolol  tartrate and 5 mg twice a day of Eliquis .  She is holding sinus rhythm based  on today's EKG  Hypertension: Blood pressure stable.       Dispo: Follow-up with Dr. Mona as previously scheduled since Dr. [name] her old cardiologist has retired.  Signed, Scot Ford, PA

## 2024-10-14 ENCOUNTER — Emergency Department (HOSPITAL_COMMUNITY)

## 2024-10-14 ENCOUNTER — Other Ambulatory Visit: Payer: Self-pay

## 2024-10-14 ENCOUNTER — Encounter (HOSPITAL_COMMUNITY): Payer: Self-pay

## 2024-10-14 ENCOUNTER — Emergency Department (HOSPITAL_COMMUNITY)
Admission: EM | Admit: 2024-10-14 | Discharge: 2024-10-14 | Disposition: A | Discharge status: Home / Self Care | Attending: Emergency Medicine | Admitting: Emergency Medicine

## 2024-10-14 DIAGNOSIS — I160 Hypertensive urgency: Secondary | ICD-10-CM | POA: Diagnosis not present

## 2024-10-14 DIAGNOSIS — Z79899 Other long term (current) drug therapy: Secondary | ICD-10-CM | POA: Diagnosis not present

## 2024-10-14 DIAGNOSIS — Z7901 Long term (current) use of anticoagulants: Secondary | ICD-10-CM | POA: Insufficient documentation

## 2024-10-14 DIAGNOSIS — R0602 Shortness of breath: Secondary | ICD-10-CM | POA: Insufficient documentation

## 2024-10-14 LAB — BASIC METABOLIC PANEL WITH GFR
Anion gap: 10 (ref 5–15)
BUN: 9 mg/dL (ref 8–23)
CO2: 29 mmol/L (ref 22–32)
Calcium: 9.9 mg/dL (ref 8.9–10.3)
Chloride: 102 mmol/L (ref 98–111)
Creatinine, Ser: 0.9 mg/dL (ref 0.44–1.00)
GFR, Estimated: >60 mL/min (ref >60)
Glucose, Bld: 93 mg/dL (ref 70–99)
Potassium: 3.8 mmol/L (ref 3.5–5.1)
Sodium: 141 mmol/L (ref 135–145)

## 2024-10-14 LAB — CBC
HCT: 34.5 % — ABNORMAL LOW (ref 36.0–46.0)
Hemoglobin: 11.2 g/dL — ABNORMAL LOW (ref 12.0–15.0)
MCH: 31 pg (ref 26.0–34.0)
MCHC: 32.5 g/dL (ref 30.0–36.0)
MCV: 95.6 fL (ref 80.0–100.0)
Platelets: 230 K/uL (ref 150–400)
RBC: 3.61 MIL/uL — ABNORMAL LOW (ref 3.87–5.11)
RDW: 14.6 % (ref 11.5–15.5)
WBC: 5 K/uL (ref 4.0–10.5)
nRBC: 0 % (ref 0.0–0.2)

## 2024-10-14 LAB — TROPONIN T, HIGH SENSITIVITY
Troponin T High Sensitivity: <15 ng/L (ref 0–19)
Troponin T High Sensitivity: <15 ng/L (ref 0–19)

## 2024-10-14 LAB — URINALYSIS, ROUTINE W REFLEX MICROSCOPIC
Bilirubin Urine: NEGATIVE
Glucose, UA: NEGATIVE mg/dL
Hgb urine dipstick: NEGATIVE
Ketones, ur: NEGATIVE mg/dL
Leukocytes,Ua: NEGATIVE
Nitrite: NEGATIVE
Protein, ur: NEGATIVE mg/dL
Specific Gravity, Urine: 1.006 (ref 1.005–1.030)
pH: 8 (ref 5.0–8.0)

## 2024-10-14 LAB — D-DIMER, QUANTITATIVE: D-Dimer, Quant: <0.27 ug{FEU}/mL (ref 0.00–0.50)

## 2024-10-14 LAB — PRO BRAIN NATRIURETIC PEPTIDE: Pro Brain Natriuretic Peptide: 466 pg/mL — ABNORMAL HIGH (ref <300.0)

## 2024-10-14 NOTE — ED Provider Notes (Signed)
 [place] EMERGENCY DEPARTMENT AT [facility] Provider Note   CSN: [id number] Arrival date & time: 10/14/24  9272     Patient presents with: Multiple Concerns   Anita Dickerson is a 78 y.o. female.   HPI 76 female presents with a chief complaint of hypertension and shortness of breath.  She states her blood pressure is typically elevated in the morning and then normalizes throughout the day.  Often is around 150 systolic.  However this morning was 161/126.  For the past week or so she has been waking up and feeling somewhat short of breath and feeling like she could feel her heart beating in her ears and in her chest.  There was no chest pain.  Symptoms usually gradually improve throughout the day.  She had to stop her Eliquis  earlier this month for an EGD and colonoscopy but otherwise reports compliance.  Thinks the shortness of breath might be somewhat similar to when she had a PE back in January but she had back pain at that time as well that she does not have today.  She has not noticed any leg swelling.  Patient also has been having some urinary urgency and some warmth when urinating for the past 2 weeks or so.  Prior to Admission medications   Medication Sig Start Date End Date Taking? Authorizing Provider  apixaban  (ELIQUIS ) 5 MG TABS tablet Take 1 tablet (5 mg total) by mouth 2 (two) times daily. 09/02/24 08/28/25  Meng, Hao, PA  ergocalciferol  (VITAMIN D2) 1.25 MG (50000 UT) capsule Take 1 capsule by mouth every 7 (seven) days. 02/27/22   [provider]  metoprolol  tartrate (LOPRESSOR ) 25 MG tablet 1/2 tablet twice a day 06/01/24   Meng, Hao, PA  Multiple Vitamins-Minerals (ONE DAILY CALCIUM/IRON) TABS Take 1 capsule by mouth daily.    [provider]    Allergies: Tetanus toxoid-containing vaccines, Nicotine, and Diazepam    Review of Systems  Respiratory:  Positive for shortness of breath.   Cardiovascular:  Positive for palpitations. Negative  for chest pain and leg swelling.  Gastrointestinal:  Negative for abdominal pain.  Genitourinary:  Positive for frequency and urgency.    Updated Vital Signs BP (!) 149/85   Pulse 71   Temp 98.4 F (36.9 C) (Oral)   Resp 16   Ht 5' 7 (1.702 m)   Wt 56.2 kg   SpO2 99%   BMI 19.42 kg/m   Physical Exam Vitals and nursing note reviewed.  Constitutional:      General: She is not in acute distress.    Appearance: She is well-developed. She is not ill-appearing or diaphoretic.  HENT:     Head: Normocephalic and atraumatic.  Cardiovascular:     Rate and Rhythm: Normal rate and regular rhythm.     Heart sounds: Normal heart sounds.  Pulmonary:     Effort: Pulmonary effort is normal.     Breath sounds: Normal breath sounds. No wheezing, rhonchi or rales.  Abdominal:     General: There is no distension.     Palpations: Abdomen is soft.     Tenderness: There is no abdominal tenderness.  Skin:    General: Skin is warm and dry.  Neurological:     Mental Status: She is alert.     (all labs ordered are listed, but only abnormal results are displayed) Labs Reviewed  CBC - Abnormal; Notable for the following components:      Result Value   RBC  3.61 (*)    Hemoglobin 11.2 (*)    HCT 34.5 (*)    All other components within normal limits  URINALYSIS, ROUTINE W REFLEX MICROSCOPIC - Abnormal; Notable for the following components:   Color, Urine STRAW (*)    All other components within normal limits  PRO BRAIN NATRIURETIC PEPTIDE - Abnormal; Notable for the following components:   Pro Brain Natriuretic Peptide 466.0 (*)    All other components within normal limits  BASIC METABOLIC PANEL WITH GFR  D-DIMER, QUANTITATIVE  TROPONIN T, HIGH SENSITIVITY  TROPONIN T, HIGH SENSITIVITY    EKG: EKG Interpretation Date/Time:  Thursday October 14 2024 07:52:50 EDT Ventricular Rate:  76 PR Interval:  163 QRS Duration:  72 QT Interval:  390 QTC Calculation: 439 R Axis:   73  Text  Interpretation: Sinus rhythm Nonspecific T abnormalities, lateral leads Confirmed by Freddi Hamilton [phone]) on 10/14/2024 7:54:25 AM  Radiology: [facility] Chest Portable 1 View Result Date: 10/14/2024 CLINICAL DATA:  Shortness of breath.  UTI. EXAM: PORTABLE CHEST 1 VIEW COMPARISON:  05/27/2024 FINDINGS: Lungs are hyperexpanded and otherwise clear. Cardiomediastinal silhouette and remainder of the exam is unchanged. IMPRESSION: No active disease. Electronically Signed   By: Toribio Agreste M.D.   On: 10/14/2024 08:25     Procedures   Medications Ordered in the ED - No data to display                                  Medical Decision Making Amount and/or Complexity of Data Reviewed External Data Reviewed: notes. Labs: ordered.    Details: Negative troponins, normal ddimer Radiology: ordered and independent interpretation performed.    Details: No CHF ECG/medicine tests: ordered and independent interpretation performed.    Details: No ischemia   Patient is well-appearing.  Initially hypertensive on arrival though this is steadily improving and most recent blood pressures 130 systolic.  Otherwise, she has no chest pain.  Did have some dyspnea that seems resolved.  Troponins are negative x 2.  D-dimer sent for otherwise low concern for PE.  Did have a small PE in January but had a different presentation and seems to have been compliant with her blood thinner.  She was complaining of some urinary frequency and urgency, bladder scan is unremarkable and UA is negative for UTI.  No flank or abdominal symptoms at this time.  I do not think CT imaging is needed.  Otherwise, she is currently asymptomatic and appears stable for discharge.  proBNP is negative for her age and clinical history and presentation is not consistent with CHF.  Will discharge with return precautions.     Final diagnoses:  Shortness of breath  Hypertensive urgency    ED Discharge Orders     None          Freddi Hamilton, MD 10/14/24 1126

## 2024-10-14 NOTE — ED Triage Notes (Addendum)
 Woke up this morning with dyspnea and HTN (161/126) Also concerned for UTI with 'warmth down there'. Polyuria. Neck pain.

## 2024-10-14 NOTE — ED Notes (Signed)
Bladder scan 18

## 2024-10-14 NOTE — Discharge Instructions (Signed)
 It is a unclear why your blood pressure is elevated in the morning.  Be sure to follow-up with your primary care provider as well as your cardiologist.  If you develop new or worsening shortness of breath, chest pain, or any other new/concerning symptoms then return to the ER for evaluation.

## 2024-10-27 ENCOUNTER — Encounter (HOSPITAL_COMMUNITY): Payer: Self-pay

## 2024-10-27 ENCOUNTER — Other Ambulatory Visit: Payer: Self-pay

## 2024-10-27 ENCOUNTER — Emergency Department (HOSPITAL_COMMUNITY)
Admission: EM | Admit: 2024-10-27 | Discharge: 2024-10-27 | Disposition: A | Discharge status: Home / Self Care | Attending: Emergency Medicine | Admitting: Emergency Medicine

## 2024-10-27 DIAGNOSIS — R040 Epistaxis: Secondary | ICD-10-CM | POA: Diagnosis present

## 2024-10-27 DIAGNOSIS — Z7901 Long term (current) use of anticoagulants: Secondary | ICD-10-CM | POA: Diagnosis not present

## 2024-10-27 LAB — CBC WITH DIFFERENTIAL/PLATELET
Abs Immature Granulocytes: 0.01 K/uL (ref 0.00–0.07)
Basophils Absolute: 0 K/uL (ref 0.0–0.1)
Basophils Relative: 1 %
Eosinophils Absolute: 0.1 K/uL (ref 0.0–0.5)
Eosinophils Relative: 2 %
HCT: 32 % — ABNORMAL LOW (ref 36.0–46.0)
Hemoglobin: 10.1 g/dL — ABNORMAL LOW (ref 12.0–15.0)
Immature Granulocytes: 0 %
Lymphocytes Relative: 43 %
Lymphs Abs: 2.1 K/uL (ref 0.7–4.0)
MCH: 30.3 pg (ref 26.0–34.0)
MCHC: 31.6 g/dL (ref 30.0–36.0)
MCV: 96.1 fL (ref 80.0–100.0)
Monocytes Absolute: 0.6 K/uL (ref 0.1–1.0)
Monocytes Relative: 12 %
Neutro Abs: 2.1 K/uL (ref 1.7–7.7)
Neutrophils Relative %: 42 %
Platelets: 184 K/uL (ref 150–400)
RBC: 3.33 MIL/uL — ABNORMAL LOW (ref 3.87–5.11)
RDW: 14.5 % (ref 11.5–15.5)
WBC: 4.9 K/uL (ref 4.0–10.5)
nRBC: 0 % (ref 0.0–0.2)

## 2024-10-27 LAB — COMPREHENSIVE METABOLIC PANEL WITH GFR
ALT: 18 U/L (ref 0–44)
AST: 26 U/L (ref 15–41)
Albumin: 4.2 g/dL (ref 3.5–5.0)
Alkaline Phosphatase: 64 U/L (ref 38–126)
Anion gap: 12 (ref 5–15)
BUN: 9 mg/dL (ref 8–23)
CO2: 26 mmol/L (ref 22–32)
Calcium: 9.3 mg/dL (ref 8.9–10.3)
Chloride: 102 mmol/L (ref 98–111)
Creatinine, Ser: 0.85 mg/dL (ref 0.44–1.00)
GFR, Estimated: >60 mL/min (ref >60)
Glucose, Bld: 87 mg/dL (ref 70–99)
Potassium: 3.7 mmol/L (ref 3.5–5.1)
Sodium: 140 mmol/L (ref 135–145)
Total Bilirubin: 0.3 mg/dL (ref 0.0–1.2)
Total Protein: 7.2 g/dL (ref 6.5–8.1)

## 2024-10-27 NOTE — Discharge Instructions (Signed)
 You are seen in the emergency department today for concerns of a nosebleed.  Your bleeding had stopped prior to you arriving and he had your labs assessed which were thankfully reassuring.  I would recommend continue with your home medications as prescribed.  Please reach out to your primary care provider for recommendations of possible treatment modifications if appropriate.  For any concerns of worsening symptoms, return to the emergency department.

## 2024-10-27 NOTE — ED Triage Notes (Addendum)
 Pt POV d/t nosebleed for 15 minutes at home and anxiety she may have a blood clot.

## 2024-10-27 NOTE — ED Provider Notes (Signed)
 [place] EMERGENCY DEPARTMENT AT [facility] Provider Note   CSN: [id number] Arrival date & time: 10/27/24  0411     Patient presents with: Epistaxis   Anita Dickerson is a 78 y.o. female. Patient presents to the ED with concerns of nosebleed.  She reports that she noted a left-sided nosebleed that last about 15 minutes while at home.  She has concerns that she may have a blood clot.  She is currently taking Eliquis  for history of atrial fibrillation and PE.  She reports compliance with her blood thinner.  She did report that while she had her nosebleed occurring earlier, she briefly felt some dizziness or lightheadedness.  This is now resolved.  No reported fall or loss of consciousness.   Epistaxis      Prior to Admission medications   Medication Sig Start Date End Date Taking? Authorizing Provider  apixaban  (ELIQUIS ) 5 MG TABS tablet Take 1 tablet (5 mg total) by mouth 2 (two) times daily. 09/02/24 08/28/25  Meng, Hao, PA  ergocalciferol  (VITAMIN D2) 1.25 MG (50000 UT) capsule Take 1 capsule by mouth every 7 (seven) days. 02/27/22   [provider]  metoprolol  tartrate (LOPRESSOR ) 25 MG tablet 1/2 tablet twice a day 06/01/24   Meng, Hao, PA  Multiple Vitamins-Minerals (ONE DAILY CALCIUM/IRON) TABS Take 1 capsule by mouth daily.    [provider]    Allergies: Tetanus toxoid-containing vaccines, Nicotine, and Diazepam    Review of Systems  HENT:  Positive for nosebleeds.   All other systems reviewed and are negative.   Updated Vital Signs BP (!) 167/110 (BP Location: Right Arm)   Pulse 82   Temp 97.9 F (36.6 C) (Oral)   Resp 18   Ht 5' 7 (1.702 m)   Wt 56.2 kg   SpO2 99%   BMI 19.42 kg/m   Physical Exam Vitals and nursing note reviewed.  Constitutional:      General: She is not in acute distress.    Appearance: She is well-developed.  HENT:     Head: Normocephalic and atraumatic.     Comments: Left nares with dried blood. Right  side unremarkable. No signs of active bleeding. Eyes:     Conjunctiva/sclera: Conjunctivae normal.  Cardiovascular:     Rate and Rhythm: Normal rate and regular rhythm.     Heart sounds: No murmur heard. Pulmonary:     Effort: Pulmonary effort is normal. No respiratory distress.     Breath sounds: Normal breath sounds.  Abdominal:     Palpations: Abdomen is soft.     Tenderness: There is no abdominal tenderness.  Musculoskeletal:        General: No swelling.     Cervical back: Neck supple.  Skin:    General: Skin is warm and dry.     Capillary Refill: Capillary refill takes less than 2 seconds.  Neurological:     Mental Status: She is alert.  Psychiatric:        Mood and Affect: Mood normal.     (all labs ordered are listed, but only abnormal results are displayed) Labs Reviewed  CBC WITH DIFFERENTIAL/PLATELET - Abnormal; Notable for the following components:      Result Value   RBC 3.33 (*)    Hemoglobin 10.1 (*)    HCT 32.0 (*)    All other components within normal limits  COMPREHENSIVE METABOLIC PANEL WITH GFR    EKG: None  Radiology: No results found.   Procedures  Medications Ordered in the ED - No data to display  Clinical Course as of 10/27/24 0718  Wed Oct 27, 2024  0715 CBC with Differential(!) [OZ]    Clinical Course User Index [OZ] Cecily Legrand LABOR, PA-C                                 Medical Decision Making Amount and/or Complexity of Data Reviewed Labs: ordered.   This patient presents to the ED for concern of nosebleed.  Differential diagnosis includes epistaxis, hemorrhage, symptomatic anemia   Lab Tests:  I Ordered, and personally interpreted labs.  The pertinent results include: CBC with slight decline in hemoglobin at 10.1 which is expected given patient's bleeding and anticoagulation status, CMP unremarkable   Problem List / ED Course:  Patient presents to the emergency department concerns of a nosebleed.  Reportedly had an  episode of left-sided nosebleed lasting about 15 minutes while at home.  States that the bleeding was somewhat profuse and she felt dizzy and lightheaded immediately after this.  She has no had bleeding stopped and feels back to baseline. On exam, patient has dried blood in left nares.  Right side unremarkable.  Will obtain basic labs for further evaluation.  No acute indication for compression at the site for difficult nosebleed. CBC shows hemoglobin of 10.1 which is lower than baseline likely from nosebleed.  CMP unremarkable.  Patient is remained stable with no recurrence of nosebleed.  Feel that she is stable for outpatient follow-up and possible ENT follow-up for further management as needed ifbecome more persistent.  She is otherwise stable for outpatient follow-up and discharged home.   Social Determinants of Health:  None  Final diagnoses:  Epistaxis    ED Discharge Orders     None          Indiah Heyden A, PA-C 10/27/24 9281    Trine Raynell Moder, MD 10/28/24 [phone]

## 2024-11-30 ENCOUNTER — Encounter: Payer: Self-pay | Admitting: Internal Medicine

## 2024-11-30 ENCOUNTER — Ambulatory Visit: Attending: Internal Medicine | Admitting: Internal Medicine

## 2024-11-30 VITALS — BP 136/72 | HR 79 | Ht 67.0 in | Wt 123.0 lb

## 2024-11-30 DIAGNOSIS — I480 Paroxysmal atrial fibrillation: Secondary | ICD-10-CM | POA: Diagnosis present

## 2024-11-30 DIAGNOSIS — R197 Diarrhea, unspecified: Secondary | ICD-10-CM | POA: Diagnosis present

## 2024-11-30 DIAGNOSIS — F32A Depression, unspecified: Secondary | ICD-10-CM | POA: Insufficient documentation

## 2024-11-30 DIAGNOSIS — I10 Essential (primary) hypertension: Secondary | ICD-10-CM | POA: Diagnosis present

## 2024-11-30 NOTE — Patient Instructions (Signed)
 Medication Instructions:  NO CHANGES  *If you need a refill on your cardiac medications before your next appointment, please call your pharmacy*  Follow-Up: At [facility], you and your health needs are our priority.  As part of our continuing mission to provide you with exceptional heart care, our providers are all part of one team.  This team includes your primary Cardiologist (physician) and Advanced Practice Providers or APPs (Physician Assistants and Nurse Practitioners) who all work together to provide you with the care you need, when you need it.  Your next appointment:    12 months with Dr. Mona or PA/NP  We recommend signing up for the patient portal called MyChart.  Sign up information is provided on this After Visit Summary.  MyChart is used to connect with patients for Virtual Visits (Telemedicine).  Patients are able to view lab/test results, encounter notes, upcoming appointments, etc.  Non-urgent messages can be sent to your provider as well.   To learn more about what you can do with MyChart, go to [email or web address].   Other Instructions

## 2024-11-30 NOTE — Progress Notes (Unsigned)
 OFFICE NOTE  Chief Complaint:  Hospital follow-up  Primary Care Physician: Patient, No Pcp Per  HPI:  Anita Dickerson is a 78 y.o. 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  I saw her recently in consultation it was in the hospital.  She had presented with A-fib and RVR.  She is complained of persistent tachycardia.  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  She reports having had a prior A-fib ablation in 2020.  She was recently hospitalized for recurrent A-fib in the setting of pulmonary embolus and restarted on Eliquis .  This would likely be long-term therapy for her.  She is contemplating a repeat ablation.  In fact she has already scheduled this at [facility] in [place].  She has received care there previously.  Today she is also here for preoperative clearance.  She is supposed to have some upcoming dental work and their office is requesting clearance.  Finally, she has some concerns about tingling and restlessness of her legs particularly at night.  04/15/2024  Anita Dickerson returns today for follow-up.  She seems somewhat upset today.  She says she has no energy and feels like giving up.  I suspect she might have some depression.  She was grieving quite a bit after the death of her husband and now she has a lot of issues including her primary care provider which is a chain of practices that apparently is going out of business.  Also she says she cannot afford her house.  She says she has trouble paying for food after paying for her rent and she is not eating well.  Besides that stress she is in A-fib.  She has an upcoming ablation scheduled at [facility] on May 12.  I highly encouraged her to follow-up with that.  Her vitals today are actually pretty good with blood pressure 126/94 and a  pulse of 93.  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 She has never used smokeless tobacco. She reports that she does not drink alcohol and does not use drugs.  ALLERGIES:  Allergies  Allergen Reactions   Tetanus Toxoid-Containing Vaccines Shortness Of Breath and Rash   Nicotine Itching    Reaction Type: Allergy   Diazepam Other (See Comments) and Rash    Reaction Type: Allergy; Reaction(s): Visual hallucinations,unknown    ROS: Pertinent items noted in HPI and remainder of comprehensive ROS otherwise negative.  HOME MEDS: Current Outpatient Medications on File Prior to  Visit  Medication Sig Dispense Refill   apixaban  (ELIQUIS ) 5 MG TABS tablet Take 1 tablet (5 mg total) by mouth 2 (two) times daily. 30 tablet 00   cyanocobalamin  (VITAMIN B12) 1000 MCG/ML injection Inject 1,000 mcg into the muscle every 30 (thirty) days.     ergocalciferol  (VITAMIN D2) 1.25 MG (50000 UT) capsule Take 1 capsule by mouth every 7 (seven) days.     metoprolol  tartrate (LOPRESSOR ) 25 MG tablet 1/2 tablet twice a day     Multiple Vitamins-Minerals (ONE DAILY CALCIUM/IRON) TABS Take 1 capsule by mouth daily.     atorvastatin (LIPITOR) 10 MG tablet Take 10 mg by mouth daily. (Patient not taking: Reported on 11/30/2024)     methocarbamol (ROBAXIN) 500 MG tablet Take 1,000  mg by mouth 2 (two) times daily as needed. (Patient not taking: Reported on 11/30/2024)     No current facility-administered medications on file prior to visit.    LABS/IMAGING: No results found for this or any previous visit (from the past 48 hours). No results found.  LIPID PANEL:    Component Value Date/Time   CHOL 192 02/13/2015 1055   TRIG 49.0 02/13/2015 1055   HDL 83.20 02/13/2015 1055   CHOLHDL 2 02/13/2015 1055   VLDL 9.8 02/13/2015 1055   LDLCALC 99 02/13/2015 1055     WEIGHTS: Wt Readings from Last 3 Encounters:  11/30/24 123 lb (55.8 kg)  10/27/24 124 lb (56.2 kg)  10/14/24 124 lb (56.2 kg)    VITALS: BP 136/72 (BP Location: Left Arm, Patient Position: Sitting, Cuff Size: Small)   Pulse 79   Ht 5' 7 (1.702 m)   Wt 123 lb (55.8 kg)   SpO2 100%   BMI 19.26 kg/m   EXAM: General appearance: alert and no distress Lungs: clear to auscultation bilaterally Heart: irregularly irregular rhythm Extremities: extremities normal, atraumatic, no cyanosis or edema Neurologic: Grossly normal  EKG:      ASSESSMENT: Persistent atrial fibrillation status post prior ablation in 2020, repeat PFA on 04/26/2024 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   Anita Dickerson continues to struggle with fatigue and I suspect has depression and anxiety.  I encouraged her to follow-up with her PCP regarding this.  Blood pressure is reasonably controlled today.  Her A-fib rate is also controlled.  She has had prior ablation and is scheduled for repeat ablation at [facility] on May 12.  I encouraged her to follow-up with that.  She has a lot of anxieties and concerns and I think she could benefit from treatment of this.  She is seeing a primary [facility] however was told recently that they are going out of business.  She will need to establish with a new PCP.  Follow-up with our group in 3 to 4 months.  Vinie KYM [name], MD, [facility],  FNLA, FACP  [place]  [facility]  Medical Director of the [facility]  [facility] Diplomate of the American Board of Clinical Lipidology Attending Cardiologist  Direct Dial: [phone]  Fax: [phone]  Website:  [email or web address] Vinie JAYSON [name] 11/30/2024, 2:33 PM
